# Patient Record
Sex: Male | Born: 1975 | Hispanic: Yes | Marital: Married | Smoking: Never smoker
Health system: Southern US, Community
[De-identification: ages and names within clinical notes are randomized; demographics above are authoritative.]

## PROBLEM LIST (undated history)

## (undated) DIAGNOSIS — B019 Varicella without complication: Secondary | ICD-10-CM

## (undated) DIAGNOSIS — E785 Hyperlipidemia, unspecified: Secondary | ICD-10-CM

## (undated) DIAGNOSIS — I2510 Atherosclerotic heart disease of native coronary artery without angina pectoris: Secondary | ICD-10-CM

## (undated) DIAGNOSIS — I82409 Acute embolism and thrombosis of unspecified deep veins of unspecified lower extremity: Secondary | ICD-10-CM

## (undated) HISTORY — DX: Varicella without complication: B01.9

## (undated) HISTORY — PX: CHOLECYSTECTOMY: SHX55

---

## 2011-02-11 ENCOUNTER — Emergency Department (HOSPITAL_COMMUNITY): Payer: 59

## 2011-02-11 ENCOUNTER — Emergency Department (HOSPITAL_COMMUNITY)
Admission: EM | Admit: 2011-02-11 | Discharge: 2011-02-11 | Disposition: A | Discharge status: Home / Self Care | Payer: 59 | Attending: Emergency Medicine | Admitting: Emergency Medicine

## 2011-02-11 DIAGNOSIS — R5383 Other fatigue: Secondary | ICD-10-CM | POA: Insufficient documentation

## 2011-02-11 DIAGNOSIS — R002 Palpitations: Secondary | ICD-10-CM | POA: Insufficient documentation

## 2011-02-11 DIAGNOSIS — R42 Dizziness and giddiness: Secondary | ICD-10-CM | POA: Insufficient documentation

## 2011-02-11 DIAGNOSIS — R0989 Other specified symptoms and signs involving the circulatory and respiratory systems: Secondary | ICD-10-CM | POA: Insufficient documentation

## 2011-02-11 DIAGNOSIS — R5381 Other malaise: Secondary | ICD-10-CM | POA: Insufficient documentation

## 2011-02-11 DIAGNOSIS — R0609 Other forms of dyspnea: Secondary | ICD-10-CM | POA: Insufficient documentation

## 2011-02-11 LAB — DIFFERENTIAL
Basophils Absolute: 0 10*3/uL (ref 0.0–0.1)
Basophils Relative: 0 % (ref 0–1)
Eosinophils Relative: 3 % (ref 0–5)
Lymphocytes Relative: 28 % (ref 12–46)
Neutro Abs: 8.1 10*3/uL — ABNORMAL HIGH (ref 1.7–7.7)

## 2011-02-11 LAB — POCT I-STAT, CHEM 8
HCT: 50 % (ref 39.0–52.0)
Hemoglobin: 17 g/dL (ref 13.0–17.0)
Potassium: 3.7 mEq/L (ref 3.5–5.1)
Sodium: 141 mEq/L (ref 135–145)

## 2011-02-11 LAB — CBC
HCT: 48 % (ref 39.0–52.0)
Hemoglobin: 16.6 g/dL (ref 13.0–17.0)
RDW: 12.8 % (ref 11.5–15.5)
WBC: 12.9 10*3/uL — ABNORMAL HIGH (ref 4.0–10.5)

## 2011-02-11 LAB — CK TOTAL AND CKMB (NOT AT ARMC)
CK, MB: 3.2 ng/mL (ref 0.3–4.0)
Total CK: 219 U/L (ref 7–232)

## 2011-02-11 LAB — D-DIMER, QUANTITATIVE: D-Dimer, Quant: 0.38 ug/mL-FEU (ref 0.00–0.48)

## 2011-02-16 ENCOUNTER — Emergency Department (INDEPENDENT_AMBULATORY_CARE_PROVIDER_SITE_OTHER): Payer: 59

## 2011-02-16 ENCOUNTER — Emergency Department (HOSPITAL_BASED_OUTPATIENT_CLINIC_OR_DEPARTMENT_OTHER)
Admission: EM | Admit: 2011-02-16 | Discharge: 2011-02-16 | Disposition: A | Discharge status: Home / Self Care | Payer: 59 | Attending: Emergency Medicine | Admitting: Emergency Medicine

## 2011-02-16 DIAGNOSIS — R42 Dizziness and giddiness: Secondary | ICD-10-CM | POA: Insufficient documentation

## 2011-02-16 DIAGNOSIS — R5383 Other fatigue: Secondary | ICD-10-CM

## 2011-02-16 DIAGNOSIS — R5381 Other malaise: Secondary | ICD-10-CM

## 2011-02-16 LAB — BASIC METABOLIC PANEL
BUN: 9 mg/dL (ref 6–23)
CO2: 24 mEq/L (ref 19–32)
Calcium: 9.3 mg/dL (ref 8.4–10.5)
Chloride: 102 mEq/L (ref 96–112)
Creatinine, Ser: 1 mg/dL (ref 0.4–1.5)
Glucose, Bld: 85 mg/dL (ref 70–99)

## 2011-02-16 LAB — CBC
MCH: 29.2 pg (ref 26.0–34.0)
MCHC: 35.3 g/dL (ref 30.0–36.0)
MCV: 82.7 fL (ref 78.0–100.0)
Platelets: 213 10*3/uL (ref 150–400)
RBC: 5.61 MIL/uL (ref 4.22–5.81)
RDW: 12.8 % (ref 11.5–15.5)

## 2011-02-16 LAB — DIFFERENTIAL
Basophils Relative: 1 % (ref 0–1)
Eosinophils Absolute: 0.2 10*3/uL (ref 0.0–0.7)
Eosinophils Relative: 2 % (ref 0–5)
Monocytes Absolute: 0.8 10*3/uL (ref 0.1–1.0)
Monocytes Relative: 7 % (ref 3–12)
Neutrophils Relative %: 67 % (ref 43–77)

## 2011-02-16 LAB — CK TOTAL AND CKMB (NOT AT ARMC): Total CK: 225 U/L (ref 7–232)

## 2011-03-06 ENCOUNTER — Ambulatory Visit: Payer: 59 | Admitting: Internal Medicine

## 2011-03-27 ENCOUNTER — Encounter: Payer: Self-pay | Admitting: Family Medicine

## 2011-03-27 ENCOUNTER — Ambulatory Visit (INDEPENDENT_AMBULATORY_CARE_PROVIDER_SITE_OTHER): Payer: 59 | Admitting: Family Medicine

## 2011-03-27 VITALS — BP 120/90 | HR 96 | Temp 98.8°F | Resp 12 | Ht 70.25 in | Wt 283.0 lb

## 2011-03-27 DIAGNOSIS — R000 Tachycardia, unspecified: Secondary | ICD-10-CM

## 2011-03-27 DIAGNOSIS — R42 Dizziness and giddiness: Secondary | ICD-10-CM

## 2011-03-27 LAB — LIPID PANEL
LDL Cholesterol: 102 mg/dL — ABNORMAL HIGH (ref 0–99)
Total CHOL/HDL Ratio: 5.5 Ratio
VLDL: 34 mg/dL (ref 0–40)

## 2011-03-27 NOTE — Patient Instructions (Signed)
Follow up promptly for any repeat episodes of dizziness.

## 2011-03-27 NOTE — Progress Notes (Signed)
  Subjective:    Patient ID: Keith Case, male    DOB: 1976-08-28, 35 y.o.   MRN: [id number]  HPI Patient seen to establish care. 2 episodes of dizziness recently evaluated in emergency room. He noted increased weakness, lightheadedness, and elevated pulse around 110. He denied any headache or diaphoresis. Denied chest discomfort. Prior workups reviewed. Each episode lasted about 30 minutes. He had several studies including CK enzymes, troponins, d-dimer, CBC, and basic metabolic panel unremarkable with the exception of minimally elevated white blood cell . Denied any associated fever or weakness. Last episode occurred about one month ago. No recurrence since then.  No vertigo and no headaches.  Patient also had 2 chest x-rays which showed no acute findings. CT of head unremarkable. Denies illicit drug use. No alcohol use. Nonsmoker. He has noted that his pulse tends to be slightly high but is not any diarrhea, nausea, vomiting, or new weight changes. Plans to start regular exercise soon.  Past medical history reveals no chronic problems. Previous cholecystectomy around age 41. No regular medications. No known drug allergies.  Family history significant for mother follow up hypertension. Father with type 2 diabetes. Patient is separated. No alcohol use. Nonsmoker. Works full-time   Review of Systems  Constitutional: Negative for fever, chills, fatigue and unexpected weight change.  HENT: Negative for trouble swallowing.   Eyes: Negative for visual disturbance.  Respiratory: Negative for cough and shortness of breath.   Cardiovascular: Negative for chest pain, palpitations and leg swelling.  Gastrointestinal: Negative for nausea, vomiting, abdominal pain, diarrhea and blood in stool.  Genitourinary: Negative for dysuria.  Skin: Negative for rash.  Neurological: Negative for seizures, syncope, weakness and headaches.  Hematological: Negative for adenopathy.  Psychiatric/Behavioral: Negative  for dysphoric mood.       Objective:   Physical Exam  Constitutional: He is oriented to person, place, and time. He appears well-developed and well-nourished. No distress.  HENT:  Right Ear: External ear normal.  Left Ear: External ear normal.  Mouth/Throat: Oropharynx is clear and moist. No oropharyngeal exudate.  Eyes: Pupils are equal, round, and reactive to light.  Neck: Neck supple. No thyromegaly present.  Cardiovascular: Normal rate, regular rhythm and normal heart sounds.   No murmur heard. Pulmonary/Chest: Effort normal and breath sounds normal. No respiratory distress. He has no wheezes. He has no rales.  Musculoskeletal: He exhibits no edema.  Lymphadenopathy:    He has no cervical adenopathy.  Neurological: He is alert and oriented to person, place, and time. No cranial nerve deficit.  Skin: No rash noted.  Psychiatric: He has a normal mood and affect. His behavior is normal.          Assessment & Plan:  Patient presents with episodic dizziness with episodes lasting approximately 30 minutes and none over the past month. Previous workup reviewed and unremarkable. Does have somewhat elevated baseline pulse. Check thyroid functions. Patient also requesting lipid check. He is encouraged to followup promptly if he has any recurrent symptoms.

## 2011-03-31 ENCOUNTER — Telehealth: Payer: Self-pay

## 2011-03-31 NOTE — Telephone Encounter (Signed)
Pt notified and verbalized understanding.

## 2011-03-31 NOTE — Telephone Encounter (Signed)
Message copied by Beverely Low on Tue Mar 31, 2011  2:55 PM ------      Message from: Kristian Covey      Created: Sat Mar 28, 2011 12:42 PM       Thyroid OK.  Lipids significant for low HDL and high triglycerides.  Work on weight loss, exercise and reduction of sugars and starches.

## 2012-04-13 ENCOUNTER — Encounter: Payer: Self-pay | Admitting: Family Medicine

## 2012-04-13 ENCOUNTER — Ambulatory Visit (INDEPENDENT_AMBULATORY_CARE_PROVIDER_SITE_OTHER): Payer: 59 | Admitting: Family Medicine

## 2012-04-13 VITALS — BP 118/78 | Temp 98.2°F | Wt 282.0 lb

## 2012-04-13 DIAGNOSIS — R5383 Other fatigue: Secondary | ICD-10-CM

## 2012-04-13 NOTE — Patient Instructions (Addendum)
Try to get more sleep. Establish more consistent exercise. Try to reduce carbohydrate intake.

## 2012-04-13 NOTE — Progress Notes (Signed)
  Subjective:    Patient ID: Keith Case, male    DOB: [birth year], 36 y.o.   MRN: [id number]  HPI  Patient complains of increased fatigue. Onset 3 days ago and actually feeling somewhat better today. He woke up Monday with very difficult time getting out of bed. He had gotten little sleep over the weekend but no acute symptoms. Denied chest pain. No dyspnea. No fever. No body aches. Has history of loud snoring but no daytime somnolence. No known observed apnea. Takes no medications. Nonsmoker. Has gained some weight recently. Plan is to start weight watcher soon. Currently not exercising regularly. Denies any depression.   Review of Systems  Constitutional: Positive for fatigue. Negative for fever, chills, appetite change and unexpected weight change.  Respiratory: Negative for shortness of breath.   Cardiovascular: Negative for chest pain, palpitations and leg swelling.  Gastrointestinal: Negative for abdominal pain.  Genitourinary: Negative for dysuria.  Neurological: Negative for dizziness and syncope.  Hematological: Negative for adenopathy. Does not bruise/bleed easily.  Psychiatric/Behavioral: Negative for dysphoric mood.       Objective:   Physical Exam  Constitutional: He appears well-developed and well-nourished.  HENT:  Mouth/Throat: Oropharynx is clear and moist.  Neck: Neck supple. No thyromegaly present.  Cardiovascular: Normal rate and regular rhythm.   No murmur heard. Pulmonary/Chest: Effort normal and breath sounds normal. No respiratory distress. He has no wheezes. He has no rales.  Musculoskeletal: He exhibits no edema.  Lymphadenopathy:    He has no cervical adenopathy.          Assessment & Plan:  Patient seen with increased fatigue. Possibly related to recent weight gain and lack of sleep. Start with increasing his sleep time and getting back to weight watchers. Establish consistent exercise. Consider labs and complete physical if has any recurrent  episodes. His fatigue is already better after getting a couple goodnight sleep compared to earlier in the week

## 2012-05-19 ENCOUNTER — Ambulatory Visit (INDEPENDENT_AMBULATORY_CARE_PROVIDER_SITE_OTHER): Payer: 59 | Admitting: Family Medicine

## 2012-05-19 ENCOUNTER — Encounter: Payer: Self-pay | Admitting: Family Medicine

## 2012-05-19 VITALS — BP 110/80 | Temp 98.6°F | Wt 281.0 lb

## 2012-05-19 DIAGNOSIS — G44209 Tension-type headache, unspecified, not intractable: Secondary | ICD-10-CM

## 2012-05-19 MED ORDER — CYCLOBENZAPRINE HCL 5 MG PO TABS: ORAL_TABLET | ORAL | Status: DC

## 2012-05-19 NOTE — Patient Instructions (Addendum)
Try to get more sleep Let me know by next week if headaches not improving.   Tension Headache (Muscle Contraction Headache) Tension headache is one of the most common causes of head pain. These headaches are usually felt as a pain over the top of your head and back of your neck. Stress, anxiety, and depression are common triggers for these headaches. Tension headaches are not life-threatening and will not lead to other types of headaches. Tension headaches can often be diagnosed by taking a history from the patient and a physical exam. Sometimes, further lab and x-ray studies are used to confirm the diagnosis. Your caregiver can advise you on how to get help solving problems that cause anxiety or stress. Antidepressants can be prescribed if depression is a problem. HOME CARE INSTRUCTIONS   If testing was done, call for your results. Remember, it is your responsibility to get the results of all testing. Do not assume everything is fine because you do not hear from your caregiver.   Only take over-the-counter or prescription medicines for pain, discomfort, or fever as directed by your caregiver.   Biofeedback, massage, or other relaxation techniques may be helpful.   Ice packs or heat to the head and neck can be used. Use these three to four times per day or as needed.   Physical therapy may be a useful addition to treatment.   If headaches continue, even with therapy, you may need to think about lifestyle changes.   Avoid excessive use of pain killers, as rebound headaches can occur.  SEEK MEDICAL CARE IF:   You develop problems with medications prescribed.   You do not respond or get no relief from medications.   You have a change from the usual headache.   You develop nausea (feeling sick to your stomach) or vomiting.  SEEK IMMEDIATE MEDICAL CARE IF:   Your headache becomes severe.   You have an unexplained oral temperature above 102 F (38.9 C).   You develop a stiff neck.    You have loss of vision.   You have muscular weakness.   You have loss of muscular control.   You develop severe symptoms different from your first symptoms.   You start losing your balance or have trouble walking.   You feel faint or pass out.  MAKE SURE YOU:   Understand these instructions.   Will watch your condition.   Will get help right away if you are not doing well or get worse.  Document Released: 08/24/2005 Document Revised: 08/13/2011 Document Reviewed: 04/12/2008 [facility] Patient Information 2012 [place], [place].

## 2012-05-19 NOTE — Progress Notes (Signed)
  Subjective:    Patient ID: Keith Case, male    DOB: 09/21/1975, 36 y.o.   MRN: [id number]  HPI  Bifrontal dull headaches. Onset last Thursday. He describes mild to moderate dual relatively constant bifrontal headache. No progression. Has some generalized fatigue. Denies any fever. No nausea or vomiting. No recent tick bites. No sinus congestion symptoms. No history of injury. Advil helps temporarily. He drinks minimal caffeine. No history of similar headache. No confusion. No seizures. No focal weakness.  Past Medical History  Diagnosis Date  . Chicken pox    Past Surgical History  Procedure Date  . Cholecystectomy     reports that he has never smoked. He does not have any smokeless tobacco history on file. His alcohol and drug histories not on file. family history includes Diabetes in his father and other and Hypertension in his father, mother, and other. No Known Allergies    Review of Systems  Constitutional: Positive for fatigue. Negative for fever, chills, appetite change and unexpected weight change.  Respiratory: Negative for cough and shortness of breath.   Cardiovascular: Negative for chest pain.  Gastrointestinal: Negative for abdominal pain.  Genitourinary: Negative for dysuria.  Skin: Negative for rash.  Neurological: Positive for headaches. Negative for seizures, syncope and weakness.  Hematological: Negative for adenopathy.  Psychiatric/Behavioral: Negative for confusion.       Objective:   Physical Exam  Constitutional: He is oriented to person, place, and time. He appears well-developed and well-nourished.  HENT:  Right Ear: External ear normal.  Left Ear: External ear normal.  Mouth/Throat: Oropharynx is clear and moist.  Eyes: Pupils are equal, round, and reactive to light.  Neck: Neck supple. No thyromegaly present.  Cardiovascular: Normal rate and regular rhythm.   Pulmonary/Chest: Effort normal and breath sounds normal. No respiratory distress. He  has no wheezes. He has no rales.  Lymphadenopathy:    He has no cervical adenopathy.  Neurological: He is alert and oriented to person, place, and time. No cranial nerve deficit. Coordination normal.  Skin: No rash noted.  Psychiatric: He has a normal mood and affect. His behavior is normal.          Assessment & Plan:  Headaches. Suspect probable muscle contraction headaches. Avoid chronic use of Advil to avoid analgesic withdrawal headache. Increased sleep. Try cyclobenzaprine 5 mg each bedtime. Touch base next week if not improving

## 2012-12-10 IMAGING — CR DG CHEST 2V
2 series · 2 of 2 positions shown · non-contrast
Comparison: None.

CLINICAL DATA: Shortness of breath.

CHEST - 2 VIEW

[w chest pa]
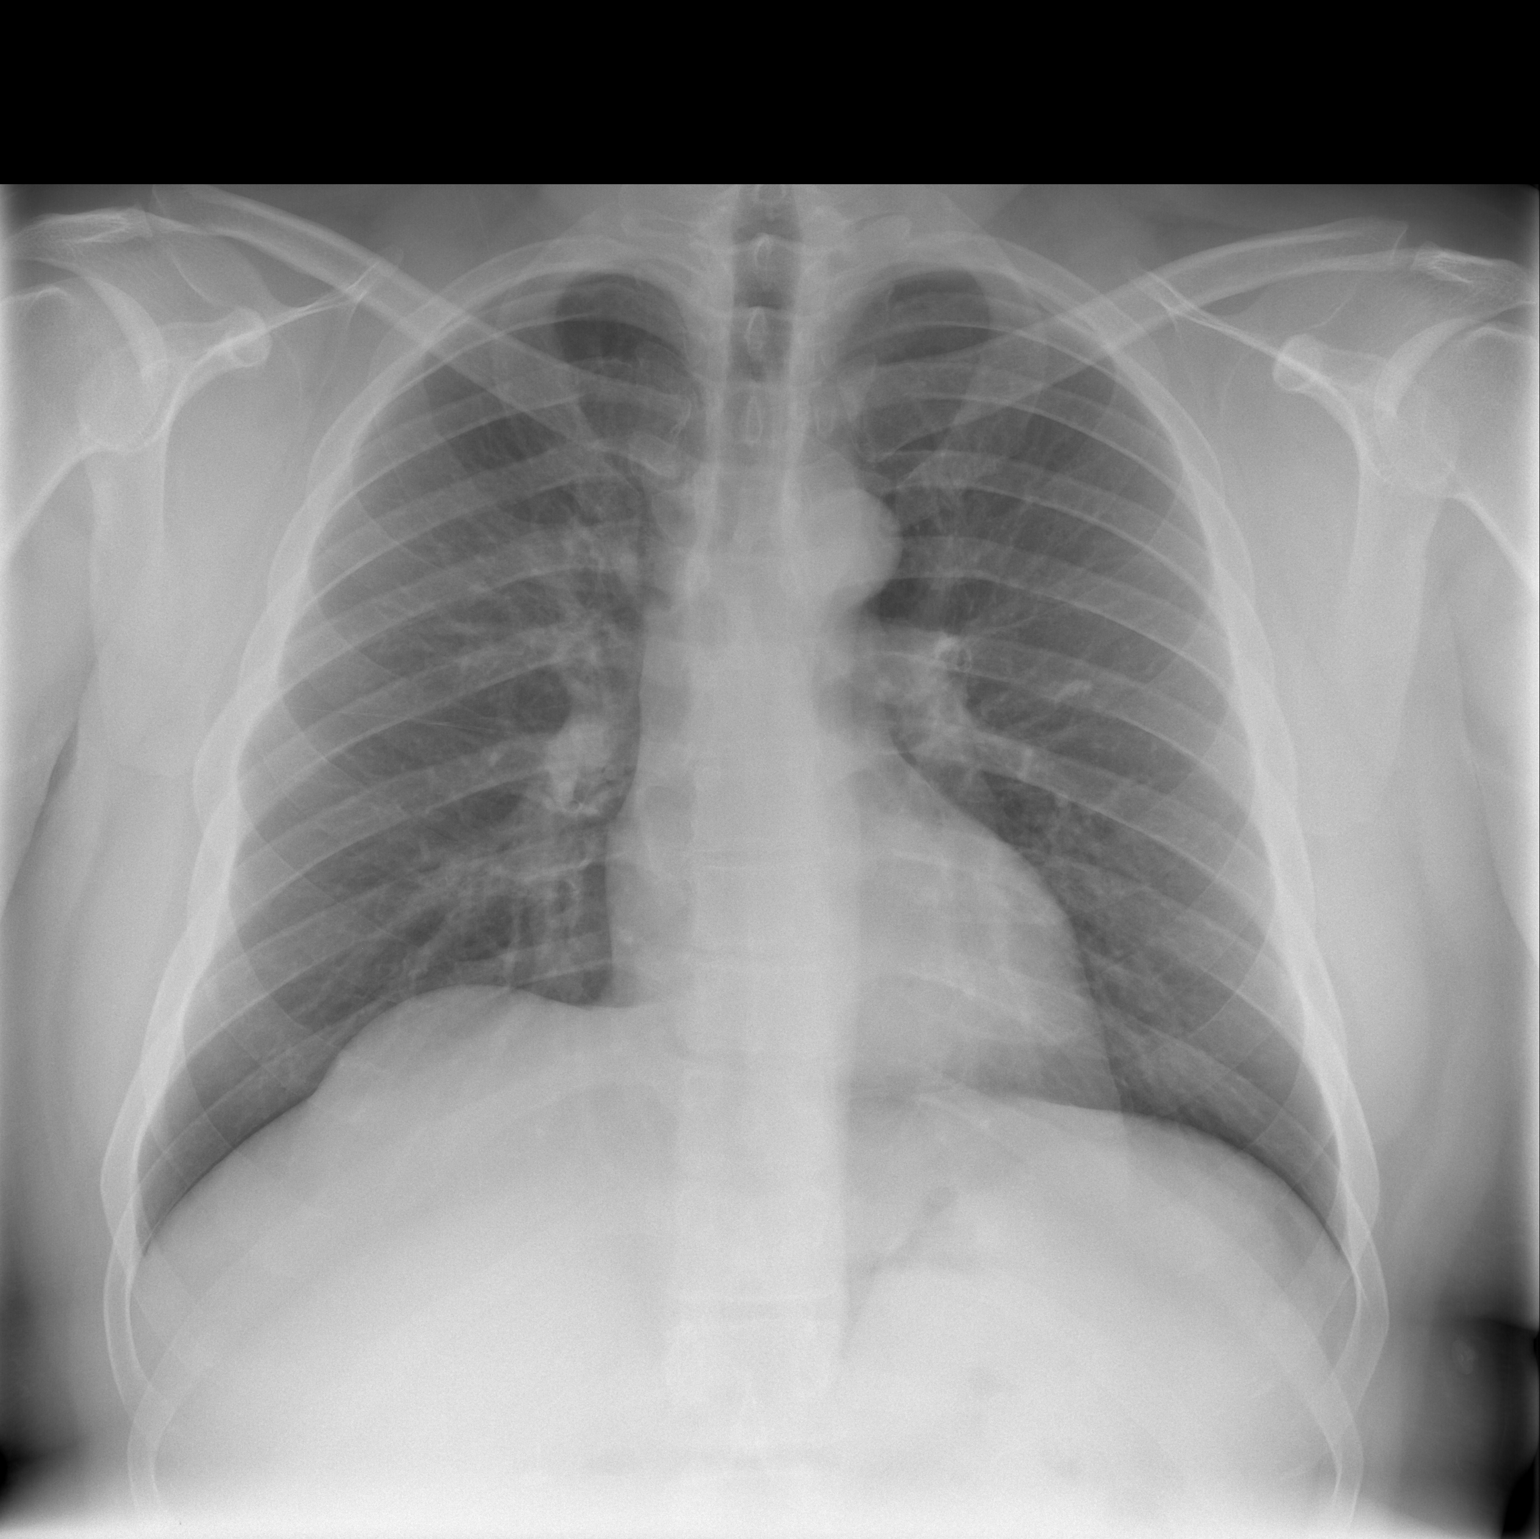

[w chest lat]
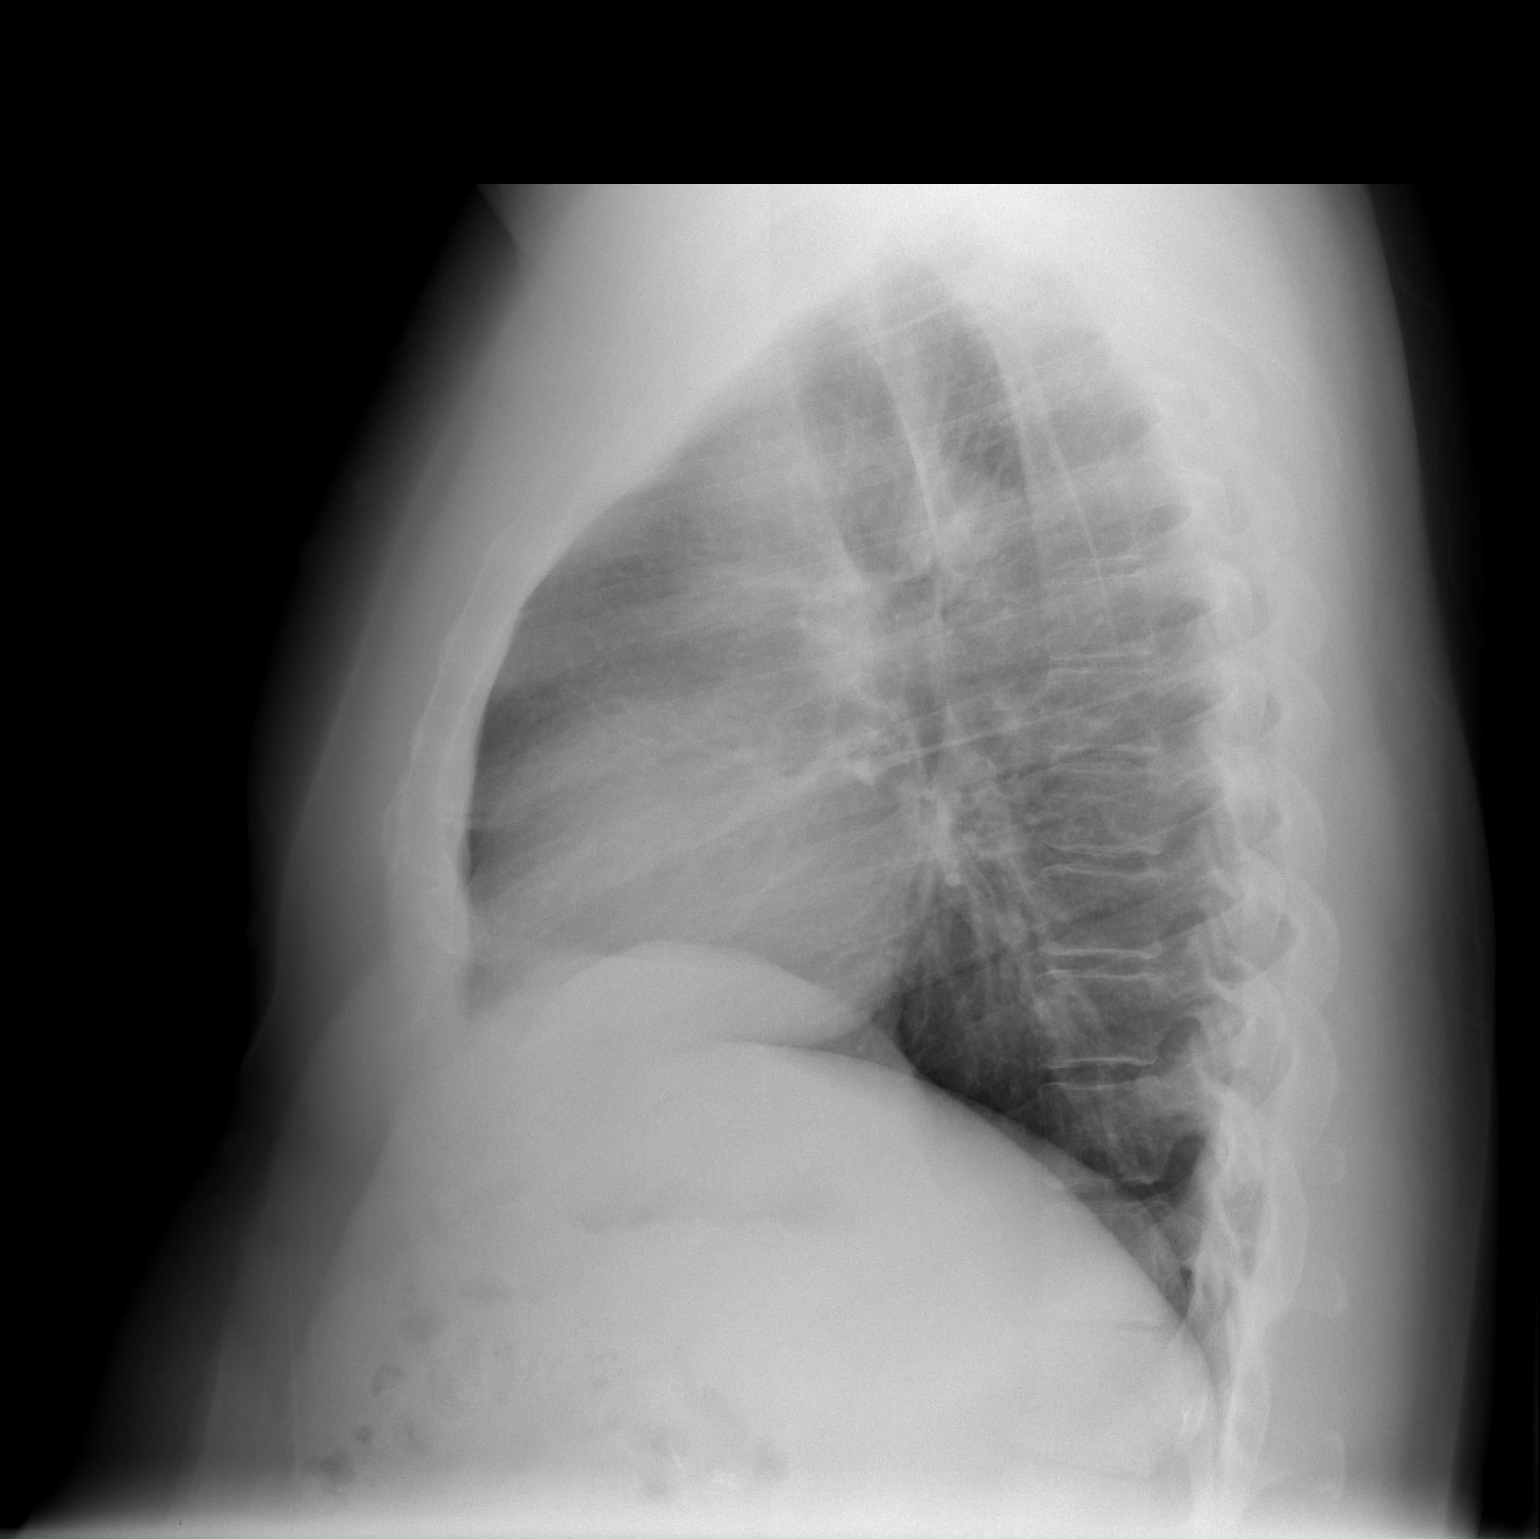

[2 of 2 positions shown; findings below may reference images not displayed]

FINDINGS: Lungs are clear.  No pneumothorax or effusion.  Heart
size normal.
IMPRESSION: Negative chest.

## 2012-12-15 IMAGING — CR DG CHEST 2V
2 series · 2 of 2 positions shown · non-contrast
Comparison: 02/11/2011

CLINICAL DATA: Dizziness

CHEST - 2 VIEW

[w chest pa]
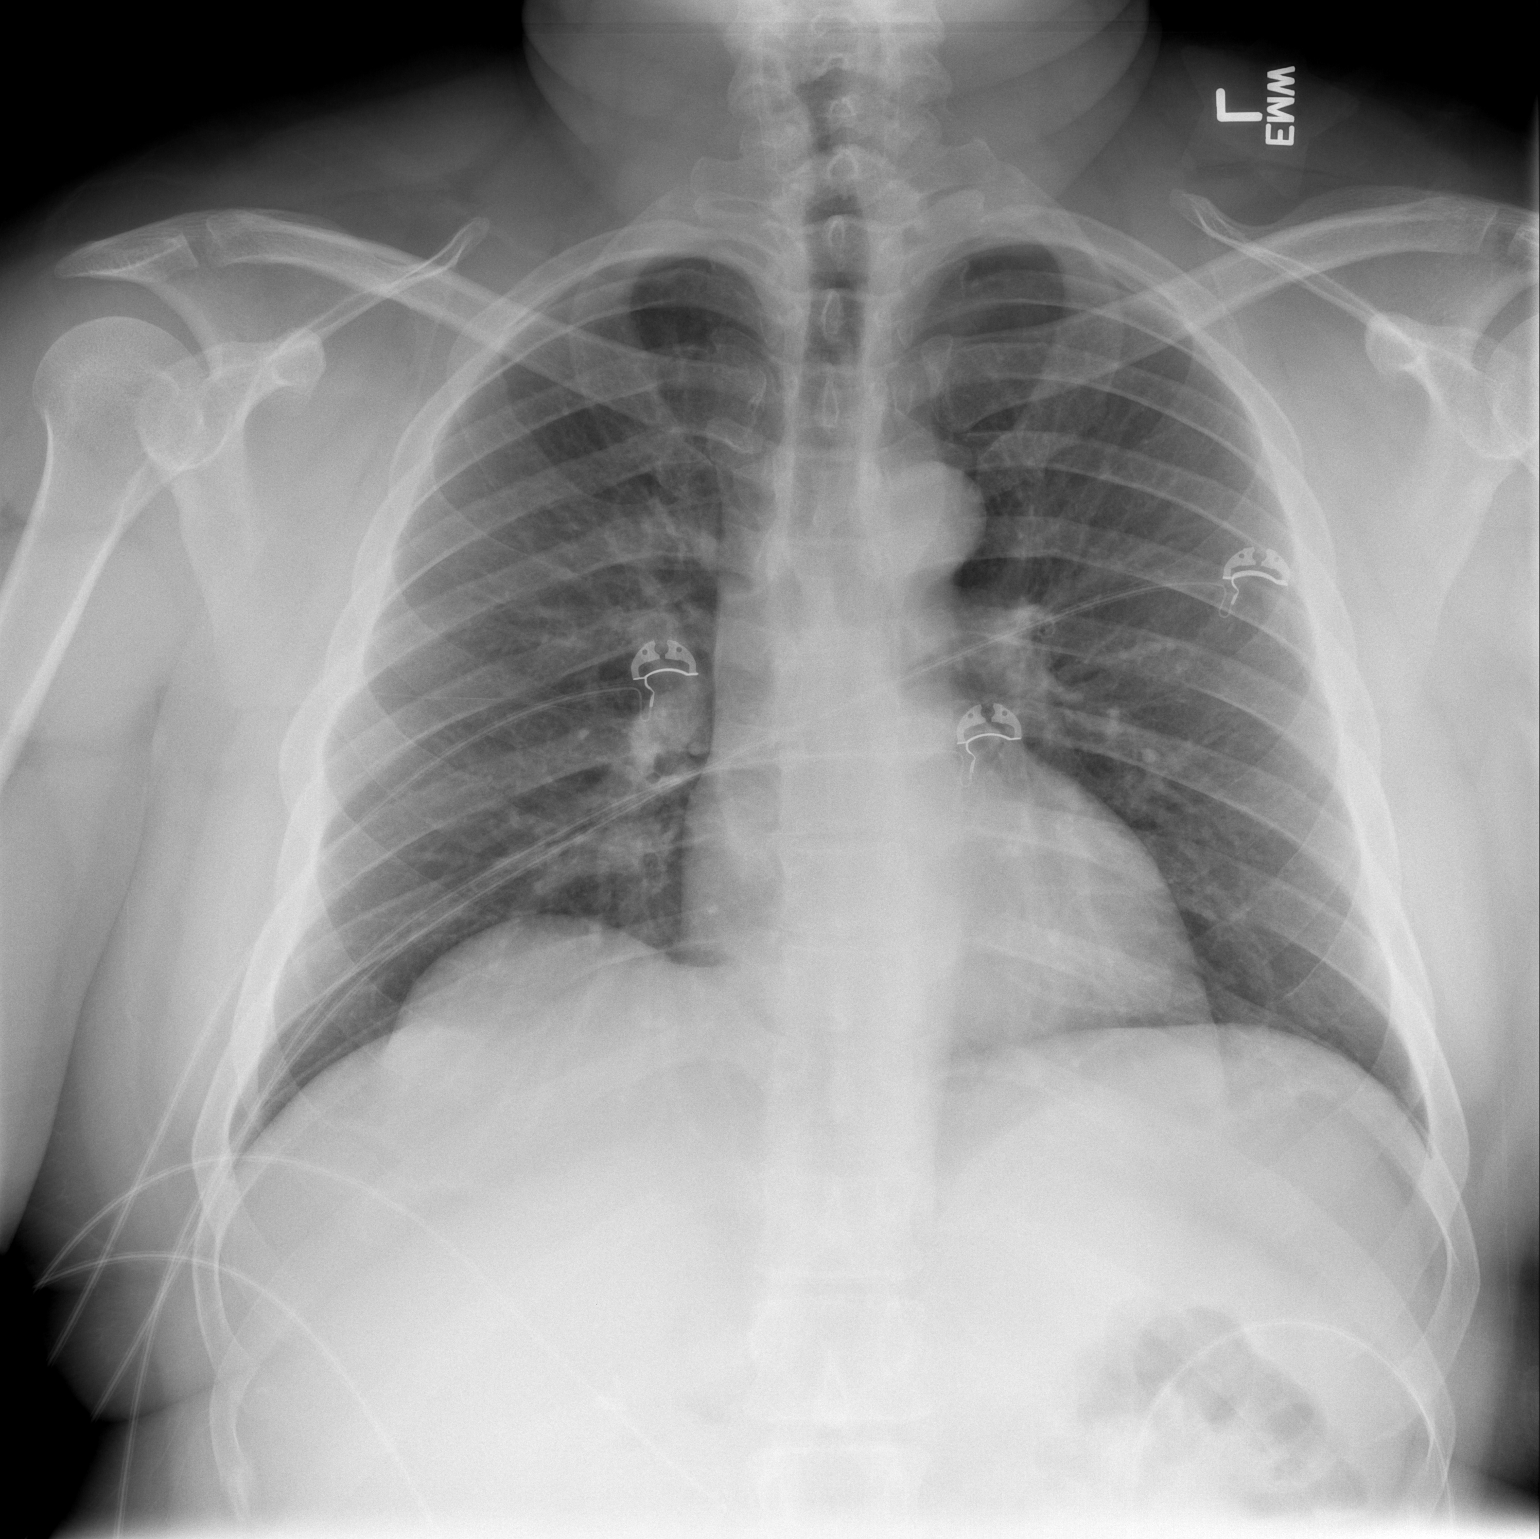

[w chest lat]
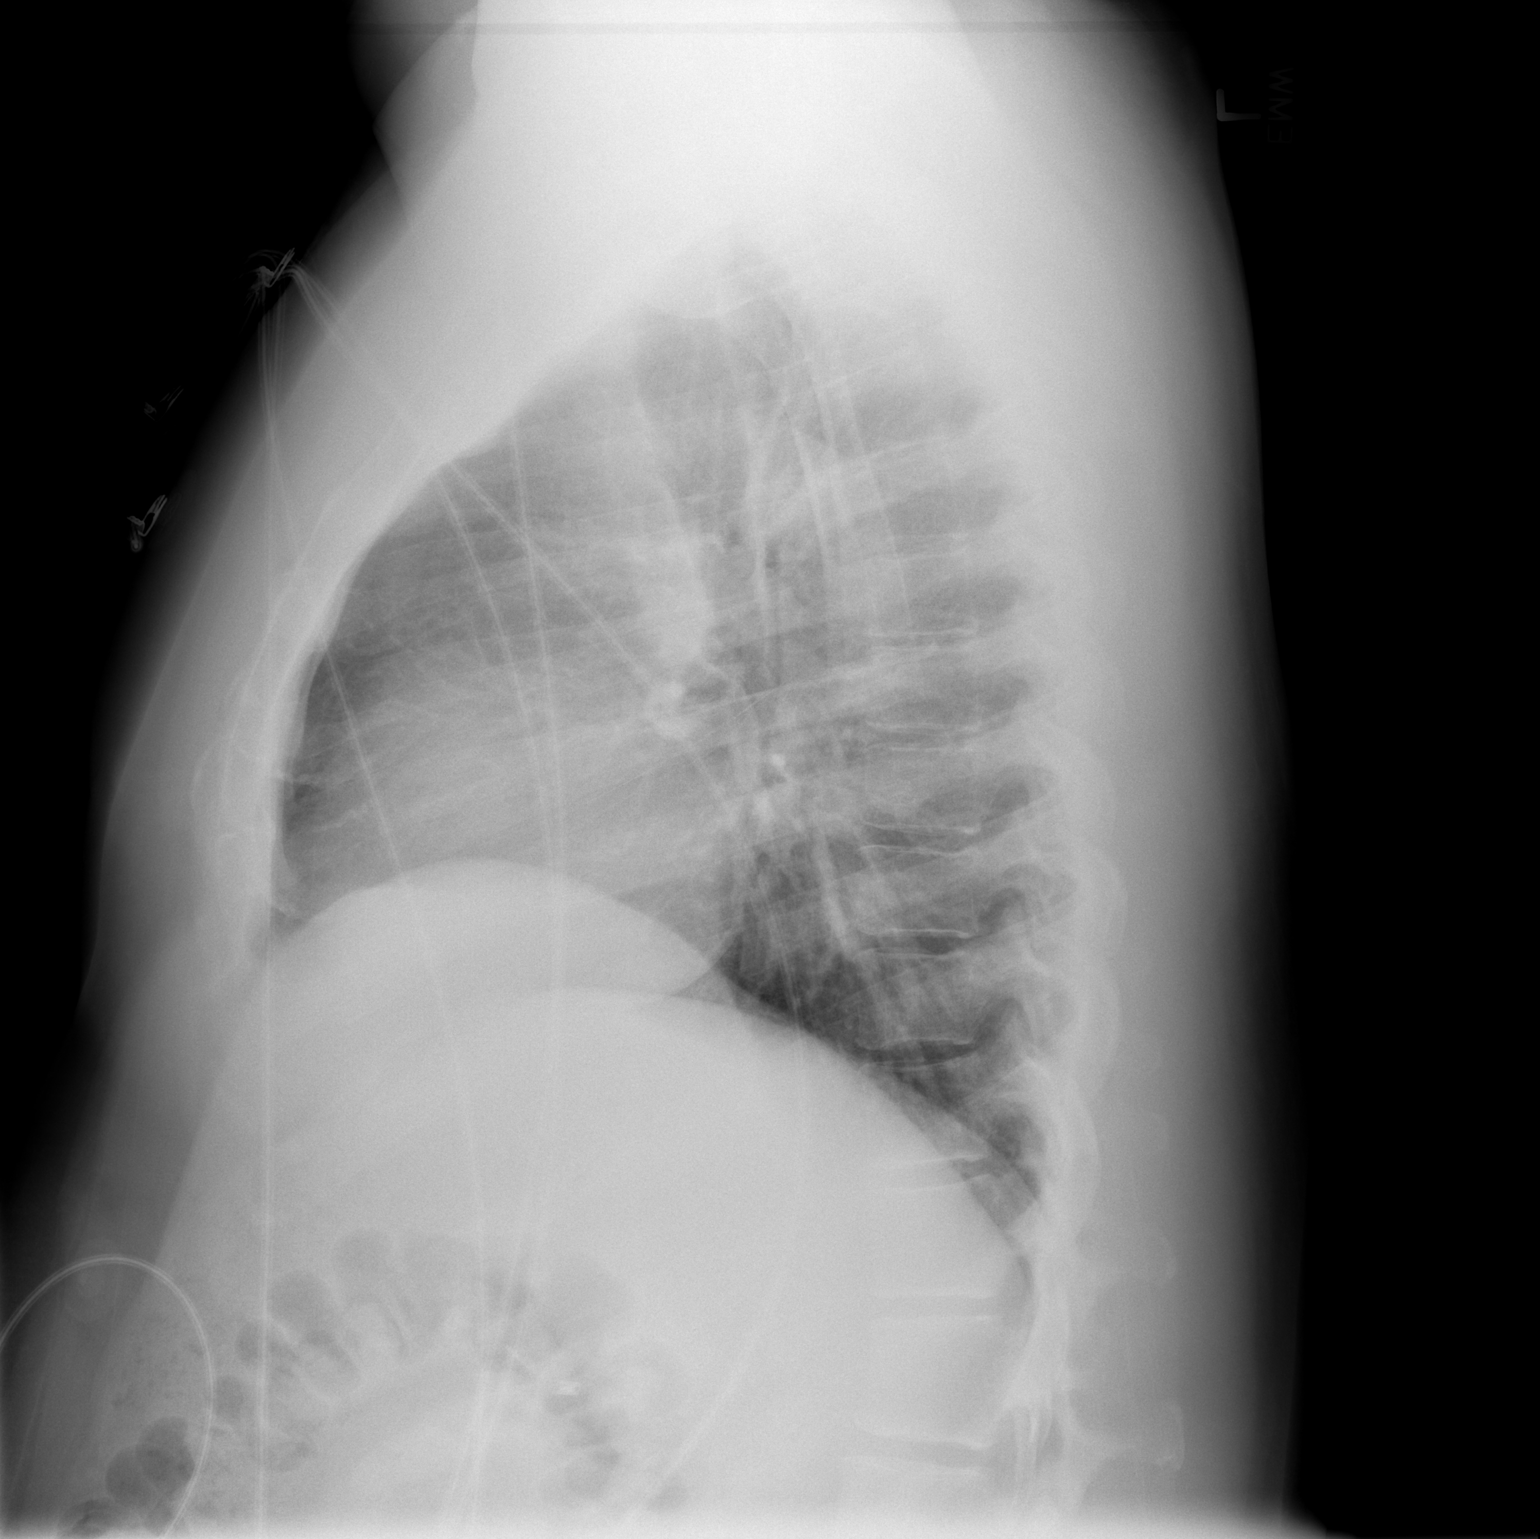

[2 of 2 positions shown; findings below may reference images not displayed]

FINDINGS: Lungs are clear. No pleural effusion or pneumothorax.

Cardiomediastinal silhouette is within normal limits.

Visualized osseous structures are within normal limits.
IMPRESSION: No evidence of acute cardiopulmonary disease.

## 2014-11-01 ENCOUNTER — Emergency Department (HOSPITAL_COMMUNITY)
Admission: EM | Admit: 2014-11-01 | Discharge: 2014-11-01 | Disposition: A | Discharge status: Home / Self Care | Payer: 59 | Attending: Emergency Medicine | Admitting: Emergency Medicine

## 2014-11-01 ENCOUNTER — Emergency Department (HOSPITAL_COMMUNITY)
Admission: EM | Admit: 2014-11-01 | Discharge: 2014-11-01 | Disposition: A | Discharge status: Nursing Home (Includes ICF) | Payer: 59 | Source: Home / Self Care | Attending: Family Medicine | Admitting: Family Medicine

## 2014-11-01 ENCOUNTER — Encounter (HOSPITAL_COMMUNITY): Payer: Self-pay | Admitting: *Deleted

## 2014-11-01 ENCOUNTER — Emergency Department (HOSPITAL_COMMUNITY): Payer: 59

## 2014-11-01 DIAGNOSIS — R079 Chest pain, unspecified: Secondary | ICD-10-CM | POA: Diagnosis not present

## 2014-11-01 DIAGNOSIS — R0789 Other chest pain: Secondary | ICD-10-CM | POA: Diagnosis not present

## 2014-11-01 DIAGNOSIS — R42 Dizziness and giddiness: Secondary | ICD-10-CM | POA: Diagnosis not present

## 2014-11-01 DIAGNOSIS — K219 Gastro-esophageal reflux disease without esophagitis: Secondary | ICD-10-CM | POA: Diagnosis not present

## 2014-11-01 DIAGNOSIS — Z8619 Personal history of other infectious and parasitic diseases: Secondary | ICD-10-CM | POA: Insufficient documentation

## 2014-11-01 LAB — I-STAT TROPONIN, ED: Troponin i, poc: 0 ng/mL (ref 0.00–0.08)

## 2014-11-01 LAB — COMPREHENSIVE METABOLIC PANEL
ALBUMIN: 4 g/dL (ref 3.5–5.2)
ALK PHOS: 76 U/L (ref 39–117)
ALT: 93 U/L — AB (ref 0–53)
AST: 49 U/L — AB (ref 0–37)
Anion gap: 8 (ref 5–15)
BILIRUBIN TOTAL: 0.8 mg/dL (ref 0.3–1.2)
BUN: 9 mg/dL (ref 6–23)
CHLORIDE: 103 mmol/L (ref 96–112)
CO2: 25 mmol/L (ref 19–32)
Calcium: 8.8 mg/dL (ref 8.4–10.5)
Creatinine, Ser: 1 mg/dL (ref 0.50–1.35)
GFR calc Af Amer: >90 mL/min (ref >90)
Glucose, Bld: 93 mg/dL (ref 70–99)
POTASSIUM: 3.8 mmol/L (ref 3.5–5.1)
Sodium: 136 mmol/L (ref 135–145)
Total Protein: 7.4 g/dL (ref 6.0–8.3)

## 2014-11-01 LAB — CBC
HCT: 47.6 % (ref 39.0–52.0)
HEMOGLOBIN: 16.5 g/dL (ref 13.0–17.0)
MCH: 29.3 pg (ref 26.0–34.0)
MCHC: 34.7 g/dL (ref 30.0–36.0)
MCV: 84.4 fL (ref 78.0–100.0)
Platelets: 239 10*3/uL (ref 150–400)
RBC: 5.64 MIL/uL (ref 4.22–5.81)
RDW: 12.9 % (ref 11.5–15.5)
WBC: 11.9 10*3/uL — ABNORMAL HIGH (ref 4.0–10.5)

## 2014-11-01 NOTE — ED Notes (Signed)
Pt  Has  A  History  Of  gerd      He  Reports     Symptoms  Of   chest  Pressure  This  Am        With  Some  dizzyness           Symptoms  Began  Around  1030  Am            -       He  Reports  The  Symptoms  Are  Actually  Better      This  Am             He    Is  Sitting  Upright on  The  Exam table  Speaking in  Complete    sentances         Skin is  Warm  And  Dry       Cap  Refill  Is  Intact

## 2014-11-01 NOTE — ED Provider Notes (Signed)
The patient is a 39 year old male, no past medical history, moderately obese, presents to the hospital after having 2 episodes of chest pain today, one while he was climbing stairs, the other while he was sitting in the waiting room. This was sharp and stabbing, left parasternal, short-lived at less than 1-2 seconds. There was no associated shortness of breath, swelling of the legs, nausea, diaphoresis, near syncope. He has no risk factors for acute coronary syndrome other than his weight, he has no risk factors for pulmonary embolism either. On exam he has clear heart and lung sounds, normal pulses, no peripheral edema, he appears well, has a normal EKG, is very low risk for significant causes of chest pain and at this time as he is asymptomatic is stable for discharge. The patient was informed of his results and the treatment plan and is in agreement.  Medical screening examination/treatment/procedure(s) were conducted as a shared visit with non-physician practitioner(s) and myself.  I personally evaluated the patient during the encounter.  Clinical Impression:   Final diagnoses:  Chest pain, unspecified chest pain type         Keith RollerBrian D Londynn Sonoda, MD 11/02/14 [phone]

## 2014-11-01 NOTE — ED Notes (Signed)
PA at bedside.

## 2014-11-01 NOTE — ED Provider Notes (Signed)
CSN: [id number]     Arrival date & time 11/01/14  1511 History   First MD Initiated Contact with Patient 11/01/14 1841     Chief Complaint  Patient presents with  . Chest Pain     HPI Comments: 39 YO male with a history of GERD presents after an episode of chest pain at the recommendation of urgent care. Pt reports that today when walking up a flight of stairs he felt a severe sharp pain to his left anterior chest with radiation of pain down his left leg; accompanied dizziness. He states the pain was gone after a few seconds and returned to baseline with no pain, dizziness, N/V, diaphoresis. He denies previous episodes of this in the past, no history of trauma.    Past Medical History  Diagnosis Date  . Chicken pox    Past Surgical History  Procedure Laterality Date  . Cholecystectomy     Family History  Problem Relation Age of Onset  . Diabetes Other   . Hypertension Other   . Hypertension Mother   . Hypertension Father   . Diabetes Father    History  Substance Use Topics  . Smoking status: Never Smoker   . Smokeless tobacco: Not on file  . Alcohol Use: No    Review of Systems  All other systems reviewed and are negative.     Allergies  Review of patient's allergies indicates no known allergies.  Home Medications   Prior to Admission medications   Medication Sig Start Date End Date Taking? Authorizing Provider  cyclobenzaprine (FLEXERIL) 5 MG tablet Take one at night 05/19/12   Kristian CoveyBruce W Burchette, MD  Omeprazole (PRILOSEC PO) Take by mouth.    Historical Provider, MD   BP 129/88 mmHg  Pulse 81  Temp(Src) 98.3 F (36.8 C) (Oral)  Resp 16  Ht 5\' 11"  (1.803 m)  SpO2 95% Physical Exam  Constitutional: He is oriented to person, place, and time. He appears well-developed and well-nourished.  HENT:  Head: Normocephalic and atraumatic.  Eyes: Conjunctivae are normal. Pupils are equal, round, and reactive to light. Right eye exhibits no discharge. Left eye exhibits  no discharge. No scleral icterus.  Neck: Normal range of motion. No JVD present. No tracheal deviation present.  Cardiovascular: Normal rate, regular rhythm, normal heart sounds and normal pulses.   Pulmonary/Chest: Effort normal and breath sounds normal. No accessory muscle usage or stridor. No respiratory distress.  No pain with AP/ Lateral compression of chest wall. No signs of trauma, rashes, or scarring   Abdominal: Soft. Normal appearance and bowel sounds are normal. There is no tenderness.  Neurological: He is alert and oriented to person, place, and time. Coordination normal.  Psychiatric: He has a normal mood and affect. His behavior is normal. Judgment and thought content normal.  Nursing note and vitals reviewed.   ED Course  Procedures (including critical care time) Labs Review Labs Reviewed  CBC - Abnormal; Notable for the following:    WBC 11.9 (*)    All other components within normal limits  COMPREHENSIVE METABOLIC PANEL - Abnormal; Notable for the following:    AST 49 (*)    ALT 93 (*)    All other components within normal limits  I-STAT TROPOININ, ED    Imaging Review Dg Chest 2 View  11/01/2014   CLINICAL DATA:  Chest pain since this morning  EXAM: CHEST  2 VIEW  COMPARISON:  02/16/2011  FINDINGS: The heart size and mediastinal contours are  within normal limits. Both lungs are clear. The visualized skeletal structures are unremarkable.  IMPRESSION: No active cardiopulmonary disease.   Electronically Signed   By: Alcide Clever M.D.   On: 11/01/2014 16:14     EKG Interpretation None      Date: 11/01/2014  Rate: 67  Rhythm: normal sinus rhythm  QRS Axis: normal  Intervals: normal  ST/T Wave abnormalities: normal  Conduction Disutrbances:none    MDM   Final diagnoses:  None   Patient denies any symptoms at time of evaluation. No significant risk factors of CAD in addition to obesity; Heart score of 1. Troponin negative, Chest x-ray and EKG normal; likely  not cardiac in nature. Pt discharged home with instructions to return or seek further medical care if symptoms persist or worsen.             Kelle Darting Zehava Turski, PA-C 11/02/14 [id number]  Vida Roller, MD 11/02/14 [phone]

## 2014-11-01 NOTE — Discharge Instructions (Signed)
Chest Pain (Nonspecific) °It is often hard to give a specific diagnosis for the cause of chest pain. There is always a chance that your pain could be related to something serious, such as a heart attack or a blood clot in the lungs. You need to follow up with your health care provider for further evaluation. °CAUSES  °· Heartburn. °· Pneumonia or bronchitis. °· Anxiety or stress. °· Inflammation around your heart (pericarditis) or lung (pleuritis or pleurisy). °· A blood clot in the lung. °· A collapsed lung (pneumothorax). It can develop suddenly on its own (spontaneous pneumothorax) or from trauma to the chest. °· Shingles infection (herpes zoster virus). °The chest wall is composed of bones, muscles, and cartilage. Any of these can be the source of the pain. °· The bones can be bruised by injury. °· The muscles or cartilage can be strained by coughing or overwork. °· The cartilage can be affected by inflammation and become sore (costochondritis). °DIAGNOSIS  °Lab tests or other studies may be needed to find the cause of your pain. Your health care provider may have you take a test called an ambulatory electrocardiogram (ECG). An ECG records your heartbeat patterns over a 24-hour period. You may also have other tests, such as: °· Transthoracic echocardiogram (TTE). During echocardiography, sound waves are used to evaluate how blood flows through your heart. °· Transesophageal echocardiogram (TEE). °· Cardiac monitoring. This allows your health care provider to monitor your heart rate and rhythm in real time. °· Holter monitor. This is a portable device that records your heartbeat and can help diagnose heart arrhythmias. It allows your health care provider to track your heart activity for several days, if needed. °· Stress tests by exercise or by giving medicine that makes the heart beat faster. °TREATMENT  °· Treatment depends on what may be causing your chest pain. Treatment may include: °¨ Acid blockers for  heartburn. °¨ Anti-inflammatory medicine. °¨ Pain medicine for inflammatory conditions. °¨ Antibiotics if an infection is present. °· You may be advised to change lifestyle habits. This includes stopping smoking and avoiding alcohol, caffeine, and chocolate. °· You may be advised to keep your head raised (elevated) when sleeping. This reduces the chance of acid going backward from your stomach into your esophagus. °Most of the time, nonspecific chest pain will improve within 2-3 days with rest and mild pain medicine.  °HOME CARE INSTRUCTIONS  °· If antibiotics were prescribed, take them as directed. Finish them even if you start to feel better. °· For the next few days, avoid physical activities that bring on chest pain. Continue physical activities as directed. °· Do not use any tobacco products, including cigarettes, chewing tobacco, or electronic cigarettes. °· Avoid drinking alcohol. °· Only take medicine as directed by your health care provider. °· Follow your health care provider's suggestions for further testing if your chest pain does not go away. °· Keep any follow-up appointments you made. If you do not go to an appointment, you could develop lasting (chronic) problems with pain. If there is any problem keeping an appointment, call to reschedule. °SEEK MEDICAL CARE IF:  °· Your chest pain does not go away, even after treatment. °· You have a rash with blisters on your chest. °· You have a fever. °SEEK IMMEDIATE MEDICAL CARE IF:  °· You have increased chest pain or pain that spreads to your arm, neck, jaw, back, or abdomen. °· You have shortness of breath. °· You have an increasing cough, or you cough   up blood.  You have severe back or abdominal pain.  You feel nauseous or vomit.  You have severe weakness.  You faint.  You have chills. This is an emergency. Do not wait to see if the pain will go away. Get medical help at once. Call your local emergency services (911 in [place] Do not drive  yourself to the hospital. MAKE SURE YOU:   Understand these instructions.  Will watch your condition.  Will get help right away if you are not doing well or get worse. Document Released: 06/03/2005 Document Revised: 08/29/2013 Document Reviewed: 03/29/2008 [facility] Patient Information 2015 [place]. This information is not intended to replace advice given to you by your health care provider. Make sure you discuss any questions you have with your health care provider.  Pt is advised to use Ibuprofen 800 mg as needed for pain

## 2014-11-01 NOTE — ED Notes (Signed)
[facility] from [facility] for L chest pain, central chest pain, dizziness and weakness after walking up 1 flight of stairs.  Hx of acid reflux.

## 2014-11-01 NOTE — ED Notes (Addendum)
Pt states after walking up some stairs he got a sharp pain in his left chest that lasted a few seconds then went away but he was left with some intermittent chest pressure. Denies sob, but reports that he did get dizzy and felt lightheaded. Pt reports that he had a similar episode last month and was seen at his PCP for it and dx was indigestion. No chest pain at this time. Pt alert, oriented, nad.

## 2014-11-01 NOTE — ED Provider Notes (Signed)
CSN: [id number]     Arrival date & time 11/01/14  1244 History   First MD Initiated Contact with Patient 11/01/14 1422     Chief Complaint  Patient presents with  . Chest Pain   (Consider location/radiation/quality/duration/timing/severity/associated sxs/prior Treatment) HPI Comments: 39 year old male considers himself to be in generally good health was climbing stairs at work today at 10:30 AM and experienced sudden acute of a pinch in the left chest followed by a lengthy period of left anterior chest pressure. For a moment as the pain started he developed sudden dizziness and "faintness" felt as though he may fall. He also complained of weakness in his left leg. He was still having mild left anterior chest pressure while sitting in our waiting room. He denies having shortness of breath, diaphoresis, GI symptoms, nausea, vomiting, reflux, indigestion or pain with movement or taking a deep breath.   Past Medical History  Diagnosis Date  . Chicken pox    Past Surgical History  Procedure Laterality Date  . Cholecystectomy     Family History  Problem Relation Age of Onset  . Diabetes Other   . Hypertension Other   . Hypertension Mother   . Hypertension Father   . Diabetes Father    History  Substance Use Topics  . Smoking status: Never Smoker   . Smokeless tobacco: Not on file  . Alcohol Use: Not on file    Review of Systems  Constitutional: Positive for activity change. Negative for fever and fatigue.  HENT: Negative.   Respiratory: Negative for cough, shortness of breath and wheezing.   Cardiovascular: Positive for chest pain. Negative for leg swelling.  Gastrointestinal: Negative for nausea, vomiting and abdominal pain.  Genitourinary: Negative.   Musculoskeletal: Negative.   Skin: Negative.   Neurological: Negative.     Allergies  Review of patient's allergies indicates no known allergies.  Home Medications   Prior to Admission medications   Medication Sig Start  Date End Date Taking? Authorizing Provider  Omeprazole (PRILOSEC PO) Take by mouth.   Yes Historical Provider, MD  cyclobenzaprine (FLEXERIL) 5 MG tablet Take one at night 05/19/12   Kristian Covey, MD   BP 118/72 mmHg  Pulse 78  Temp(Src) 98.6 F (37 C) (Oral)  Resp 14  SpO2 100% Physical Exam  Constitutional: He appears well-developed and well-nourished. No distress.  Eyes: Conjunctivae are normal.  Neck: Normal range of motion. Neck supple.  Cardiovascular: Normal rate, regular rhythm, normal heart sounds and intact distal pulses.   No murmur heard. Pulmonary/Chest: Effort normal and breath sounds normal. No respiratory distress. He has no wheezes. He has no rales.  Neurological: He is alert. He exhibits normal muscle tone.  Skin: Skin is warm. No rash noted. He is not diaphoretic. No erythema.  Nursing note and vitals reviewed.   ED Course  Procedures (including critical care time) Labs Review Labs Reviewed - No data to display  Imaging Review No results found. EKG: Normal sinus rhythm. No ectopy. Normal axis. No S-ST ischemic type changes.  MDM   1. Left chest pressure   2. Dizziness    After consulting with Dr. Shelly Flatten will transfer this patient to the emergency department for chest pain evaluation. The patient is certain that he is completely asymptomatic at this time. He has no chest pain pressure of any type, no shortness of breath, no dizziness or weakness. His EKG is normal sinus rhythm and otherwise normal. He will be sent down via shuttle.  Hayden Rasmussenavid Tamotsu Wiederholt, NP 11/01/14 1451

## 2015-04-25 ENCOUNTER — Encounter (HOSPITAL_COMMUNITY): Payer: 59

## 2015-04-30 ENCOUNTER — Other Ambulatory Visit (HOSPITAL_COMMUNITY): Payer: Self-pay | Admitting: Family Medicine

## 2015-04-30 ENCOUNTER — Ambulatory Visit (HOSPITAL_COMMUNITY)
Admission: RE | Admit: 2015-04-30 | Discharge: 2015-04-30 | Disposition: A | Discharge status: Home / Self Care | Payer: 59 | Source: Ambulatory Visit | Attending: Cardiology | Admitting: Cardiology

## 2015-04-30 DIAGNOSIS — R52 Pain, unspecified: Secondary | ICD-10-CM | POA: Diagnosis present

## 2015-04-30 DIAGNOSIS — R791 Abnormal coagulation profile: Secondary | ICD-10-CM | POA: Diagnosis not present

## 2015-04-30 DIAGNOSIS — M79605 Pain in left leg: Secondary | ICD-10-CM | POA: Diagnosis not present

## 2016-08-20 ENCOUNTER — Ambulatory Visit: Payer: Self-pay | Admitting: Allergy & Immunology

## 2016-09-24 ENCOUNTER — Ambulatory Visit: Payer: Self-pay | Admitting: Allergy & Immunology

## 2018-10-17 ENCOUNTER — Ambulatory Visit (INDEPENDENT_AMBULATORY_CARE_PROVIDER_SITE_OTHER): Payer: Self-pay | Admitting: Primary Care

## 2022-09-22 DIAGNOSIS — Z1211 Encounter for screening for malignant neoplasm of colon: Secondary | ICD-10-CM | POA: Diagnosis not present

## 2022-10-08 DIAGNOSIS — R42 Dizziness and giddiness: Secondary | ICD-10-CM | POA: Diagnosis not present

## 2022-10-08 DIAGNOSIS — R072 Precordial pain: Secondary | ICD-10-CM | POA: Diagnosis not present

## 2022-10-08 DIAGNOSIS — K219 Gastro-esophageal reflux disease without esophagitis: Secondary | ICD-10-CM | POA: Diagnosis not present

## 2022-10-08 DIAGNOSIS — R079 Chest pain, unspecified: Secondary | ICD-10-CM | POA: Diagnosis not present

## 2022-10-08 DIAGNOSIS — R9431 Abnormal electrocardiogram [ECG] [EKG]: Secondary | ICD-10-CM | POA: Diagnosis not present

## 2022-10-08 DIAGNOSIS — R0789 Other chest pain: Secondary | ICD-10-CM | POA: Diagnosis not present

## 2022-10-08 DIAGNOSIS — I452 Bifascicular block: Secondary | ICD-10-CM | POA: Diagnosis not present

## 2023-01-31 DIAGNOSIS — X58XXXA Exposure to other specified factors, initial encounter: Secondary | ICD-10-CM | POA: Diagnosis not present

## 2023-01-31 DIAGNOSIS — M79604 Pain in right leg: Secondary | ICD-10-CM | POA: Diagnosis not present

## 2023-01-31 DIAGNOSIS — M25561 Pain in right knee: Secondary | ICD-10-CM | POA: Diagnosis not present

## 2023-01-31 DIAGNOSIS — S86911A Strain of unspecified muscle(s) and tendon(s) at lower leg level, right leg, initial encounter: Secondary | ICD-10-CM | POA: Diagnosis not present

## 2023-01-31 DIAGNOSIS — X500XXA Overexertion from strenuous movement or load, initial encounter: Secondary | ICD-10-CM | POA: Diagnosis not present

## 2023-01-31 DIAGNOSIS — M79662 Pain in left lower leg: Secondary | ICD-10-CM | POA: Diagnosis not present

## 2023-01-31 DIAGNOSIS — Y9389 Activity, other specified: Secondary | ICD-10-CM | POA: Diagnosis not present

## 2023-01-31 DIAGNOSIS — S86811A Strain of other muscle(s) and tendon(s) at lower leg level, right leg, initial encounter: Secondary | ICD-10-CM | POA: Diagnosis not present

## 2023-02-09 DIAGNOSIS — M1712 Unilateral primary osteoarthritis, left knee: Secondary | ICD-10-CM | POA: Diagnosis not present

## 2023-02-09 DIAGNOSIS — Z9181 History of falling: Secondary | ICD-10-CM | POA: Diagnosis not present

## 2023-02-09 DIAGNOSIS — M25561 Pain in right knee: Secondary | ICD-10-CM | POA: Diagnosis not present

## 2023-02-15 DIAGNOSIS — G4733 Obstructive sleep apnea (adult) (pediatric): Secondary | ICD-10-CM | POA: Diagnosis not present

## 2023-03-29 DIAGNOSIS — M25561 Pain in right knee: Secondary | ICD-10-CM | POA: Diagnosis not present

## 2023-03-31 DIAGNOSIS — M25561 Pain in right knee: Secondary | ICD-10-CM | POA: Diagnosis not present

## 2023-04-05 DIAGNOSIS — M25561 Pain in right knee: Secondary | ICD-10-CM | POA: Diagnosis not present

## 2023-04-07 DIAGNOSIS — M25561 Pain in right knee: Secondary | ICD-10-CM | POA: Diagnosis not present

## 2023-04-12 DIAGNOSIS — M25561 Pain in right knee: Secondary | ICD-10-CM | POA: Diagnosis not present

## 2023-07-20 DIAGNOSIS — M25461 Effusion, right knee: Secondary | ICD-10-CM | POA: Diagnosis not present

## 2023-07-20 DIAGNOSIS — M170 Bilateral primary osteoarthritis of knee: Secondary | ICD-10-CM | POA: Diagnosis not present

## 2023-07-20 DIAGNOSIS — M25462 Effusion, left knee: Secondary | ICD-10-CM | POA: Diagnosis not present

## 2023-07-20 DIAGNOSIS — M1712 Unilateral primary osteoarthritis, left knee: Secondary | ICD-10-CM | POA: Diagnosis not present

## 2023-08-14 DIAGNOSIS — I4510 Unspecified right bundle-branch block: Secondary | ICD-10-CM | POA: Diagnosis not present

## 2023-08-14 DIAGNOSIS — R059 Cough, unspecified: Secondary | ICD-10-CM | POA: Diagnosis not present

## 2023-08-14 DIAGNOSIS — R918 Other nonspecific abnormal finding of lung field: Secondary | ICD-10-CM | POA: Diagnosis not present

## 2023-08-14 DIAGNOSIS — R0602 Shortness of breath: Secondary | ICD-10-CM | POA: Diagnosis not present

## 2023-08-14 DIAGNOSIS — J180 Bronchopneumonia, unspecified organism: Secondary | ICD-10-CM | POA: Diagnosis not present

## 2023-08-16 NOTE — Discharge Summary (Signed)
 [facility]  [facility] Inpatient Discharge Summary  PCP: Refugia Lofts (Inactive) Discharge Details   Admit date:         08/14/2023 Discharge date:        08/16/2023  Hospital Days:    2 days  Code Status:   Full Code Advanced Directives on file: No Directive        Discharge Diagnoses:  Principal Problem:   Community acquired pneumonia of left lower lobe of lung Active Problems:   Community acquired pneumonia of left lung   Acute hypoxic respiratory failure (*)   Severe obesity (*)   Sepsis without acute organ dysfunction (*)   [employer] Current Status   Urine legionella antigen In process   Culture, Blood Blood Peripheral Preliminary result   Culture, Blood Blood Peripheral Preliminary result       Follow-Up Appointments Suggested: No follow-up provider specified. Follow-Up Appointments Already Scheduled: No future appointments.  Discharge Medications: Current Discharge Medication List     DISCONTINUED medications     ibuprofen (ADVIL,MOTRIN) 800 mg tablet        NEW medications   Details  amoxicillin-clavulanate (AUGMENTIN) 875-125 mg per tablet Take one tablet by mouth 2 (two) times daily for 5 days. Start date: 08/16/2023, End date: 08/21/2023    azithromycin (ZITHROMAX) 500 mg tablet Take one tablet (500 mg dose) by mouth daily for 3 days. Take 1 tablet daily for 3 days. Start date: 08/16/2023, End date: 08/19/2023       CONTINUED medications   Details  acetaminophen  (TYLENOL ) 500 mg tablet Take two tablets (1,000 mg dose) by mouth every 6 (six) hours as needed for Pain.    Pseudoephedrine-APAP-DM (DAYQUIL MULTI-SYMPTOM PO) Take 2 capsules by mouth every 4 (four) hours as needed (cold symptoms).        Allergies: No Known Allergies  Consultations this Admission: None  Procedures/Imaging:     CT Angio Pulmonary  Final Result  IMPRESSION:  1.  No pulmonary embolism.  2.  Findings  suggesting bronchopneumonia in the left lung. Mild, likely reactive mediastinal and left hilar lymphadenopathy.                Electronically Signed by: Prentice Clay, MD on 08/14/2023 5:29 PM    XR Chest Ap Portable  Final Result  IMPRESSION:  No acute pulmonary abnormality.      Electronically Signed by: Ozell JONETTA Cordial, MD on 08/14/2023 1:25 PM      Pertinent Labs:  Cardiac Labs: No results for input(s): CK, CKMB, CTNI, BNP in the last 168 hours. CBC: Recent Labs    Units 08/16/23 0144 08/15/23 0129 08/14/23 1421  WBC thou/mcL 8.6 10.9* 10.3  HGB gm/dL 86.6* 85.3 84.5  PLT thou/mcL 232 234 227   BMP: Recent Labs    Units 08/16/23 0144 08/14/23 1421  NA mmol/L 140 138  K mmol/L 3.5* 3.8  CL mmol/L 104 102  CO2 mmol/L 25 23  BUN mg/dL 9 10  CREATININE mg/dL 9.23 9.15  MAGNESIUM mg/dL 1.9  --    Lipid Panel: No results for input(s): CHOL, TRIG, HDL, LDL in the last 168 hours. Liver Enzymes: Recent Labs    Units 08/16/23 0144 08/14/23 1421  AST U/L 30 30  ALT U/L 39 44  ALKPHOS U/L 57 74  BILITOT mg/dL 0.3 0.7   Endocrine Panels: Recent Labs    Units 08/16/23 0144 08/14/23 2252 08/14/23 1421  HGBA1C %  --  5.6  --   GLUCOSE mg/dL 95  --  95    Hospital Course   Physicians involved in care during this hospitalization Attending Provider: Harlene LITTIE Sample, MD Attending Provider: Yancy Sorrel, MD Attending Provider: Atlee Abernethy, MD Admitting Provider: Yancy Sorrel, MD Consulting Physician: Yancy Sorrel, MD    Hospital Course:     47 yo M treated for sepsis due to L lung mycoplasma PNA. He received IV LR 100 cc/hr, IV azithromycin 500 mg daily, IV ceftriaxone 2 g daily and Mucinex DM 1 tab PO q12. WBC improved from 10.9 to 8.6. On 08/16/2023 he was feeling well enough to go home and was actually asking about going back to work tmr. He will go home with scripts for PO azithromycin and PO augmentin to complete the course  at home. He was advised to wear a mask when outside of his house.    BP (!) 135/98 (Patient Position: Lying)   Pulse 83   Temp 99 F (37.2 C) (Oral)   Resp 18   Ht 1.803 m (5' 11)   Wt 111.8 kg (246 lb 8 oz)   SpO2 (!) 89%   BMI 34.38 kg/m   Physical Exam Constitutional:      Appearance: Normal appearance.  HENT:     Head: Normocephalic.     Mouth/Throat:     Mouth: Mucous membranes are moist.  Cardiovascular:     Rate and Rhythm: Normal rate and regular rhythm.  Pulmonary:     Effort: Pulmonary effort is normal.  Abdominal:     Palpations: Abdomen is soft.  Musculoskeletal:        General: Normal range of motion.     Cervical back: Neck supple.  Skin:    General: Skin is warm.  Neurological:     Mental Status: He is alert. Mental status is at baseline.  Psychiatric:        Mood and Affect: Mood normal.     Post Hospital Care      Oxygen Orders for Discharge: O2 Device: None (Room air) SpO2: 89 %  Diet: Diet and Nourishment Orders (From admission, onward)     Start       08/14/23 1959  Regular Diet  Diet effective now                      Lines/Drains/Airways: Patient Lines/Drains/Airways Status     Active LDAs     Name Placement date Placement time Site Days   Peripheral IV 18 G Left Antecubital 08/14/23  1631  Antecubital  1               Home Health Orders: DME Orders (From admission, onward)    None      Home Health Agency     None       I spent 35 minutes performing discharge services.   Electronically signed: Zackery Sira, MD 08/16/2023 / 9:07 AM

## 2023-08-17 NOTE — Progress Notes (Signed)
 CARE COORDINATOR CONTACT WITH PATIENT/CAREGIVER AFTER HOSPITAL DISCHARGE   Patient contacted.  Encouraged to schedule Hospital follow up appointment with their Non NH PCP.

## 2023-09-06 DIAGNOSIS — J189 Pneumonia, unspecified organism: Secondary | ICD-10-CM | POA: Diagnosis not present

## 2023-09-06 DIAGNOSIS — G4733 Obstructive sleep apnea (adult) (pediatric): Secondary | ICD-10-CM | POA: Diagnosis not present

## 2023-12-13 DIAGNOSIS — M25462 Effusion, left knee: Secondary | ICD-10-CM | POA: Diagnosis not present

## 2023-12-13 DIAGNOSIS — M170 Bilateral primary osteoarthritis of knee: Secondary | ICD-10-CM | POA: Diagnosis not present

## 2023-12-13 DIAGNOSIS — M25461 Effusion, right knee: Secondary | ICD-10-CM | POA: Diagnosis not present

## 2024-04-03 DIAGNOSIS — M1712 Unilateral primary osteoarthritis, left knee: Secondary | ICD-10-CM | POA: Diagnosis not present

## 2024-04-03 DIAGNOSIS — Z96662 Presence of left artificial ankle joint: Secondary | ICD-10-CM | POA: Diagnosis not present

## 2024-04-03 DIAGNOSIS — M96830 Postprocedural hemorrhage and hematoma of a musculoskeletal structure following a musculoskeletal system procedure: Secondary | ICD-10-CM | POA: Diagnosis not present

## 2024-04-03 DIAGNOSIS — Z96652 Presence of left artificial knee joint: Secondary | ICD-10-CM | POA: Diagnosis not present

## 2024-04-03 DIAGNOSIS — Z6835 Body mass index (BMI) 35.0-35.9, adult: Secondary | ICD-10-CM | POA: Diagnosis not present

## 2024-04-03 DIAGNOSIS — M179 Osteoarthritis of knee, unspecified: Secondary | ICD-10-CM | POA: Diagnosis not present

## 2024-04-03 DIAGNOSIS — E669 Obesity, unspecified: Secondary | ICD-10-CM | POA: Diagnosis not present

## 2024-04-03 DIAGNOSIS — Z471 Aftercare following joint replacement surgery: Secondary | ICD-10-CM | POA: Diagnosis not present

## 2024-04-03 DIAGNOSIS — G4733 Obstructive sleep apnea (adult) (pediatric): Secondary | ICD-10-CM | POA: Diagnosis not present

## 2024-04-11 NOTE — ED Provider Notes (Signed)
 [facility]  ED Provider Note  Keith Case 48 y.o. male DOB: [birth year] MRN: [id number] Tele-Medical screening initiated and orders placed by Harlene JINNY Fallow, PA-C. 04/11/2024 / 6:35 PM  48 y.o. male presents with complaints of a fast heart beat.  He had a knee replacement on July 28.  Pt took his pulse and HR 120 ang BP was high.  Pt was sweating during PT.  He also states he is sweating at night.  Pt denies SOB. Denies chest pain.  Pt is taking ASA. NO hx of blood clots.  Patient seen and received a tele-medical screening examination in triage.  The provider performing the medical screening exam was not located at the facility and was located remotely.  Patient understands that the provider is seeing them remotely and consents to the exam.  Additionally, the patient has been advised of the risks and benefits of a video visit that differ from in-person treatment such as: a limited physical examination, unforeseen disruptions to connectivity, risks to patient confidentiality and privacy. Patient was also explained the risks of the exam which include video or sound dysfunction, or minor portions of the exam performed in conjunction with ancillary staff in the ED.  Appropriate orders have been initiated based on my brief physical exam and HPI. Patient placed in appropriate area until a treatment room becomes available for further evaluation and management by the in-house provider.  This tele-medical screening exam was electronically signed by Harlene JINNY Fallow, PA-C on 04/11/2024 at 6:35 PM   History   Chief Complaint  Patient presents with  . Tachycardia    [facility]. His Physical therapist said he needed to get checked out.  He denies any problems.      Patient presents with a history of having a total knee replacement done on July 28 at the [facility] in [place]  Patient is going to physical therapy and today the therapist  became concerned because he had elevated heart rate and was diaphoretic/[facility] who [facility] come to [facility] emergency room to further evaluate make sure no blood clots  Patient has no significant other past medical history  Medicines at this time include his postoperative pain medicine  Allergies, none  Smoking none alcohol none  Operation left knee, cholecystectomy       Past Medical History:  Diagnosis Date  . GERD (gastroesophageal reflux disease)   . Sleep apnea     Past Surgical History:  Procedure Laterality Date  . Cholecystectomy      Social History   Substance and Sexual Activity  Alcohol Use None   Tobacco Use History[1] E-Cigarettes  . Vaping Use Never User   . Start Date    . Cartridges/Day    . Quit Date     Social History   Substance and Sexual Activity  Drug Use Never         Allergies[2]  Current Discharge Medication List     CONTINUE these medications which have NOT CHANGED   Details  acetaminophen  (TYLENOL ) 500 mg tablet Take two tablets (1,000 mg dose) by mouth every 6 (six) hours as needed for Pain.    Pseudoephedrine-APAP-DM (DAYQUIL MULTI-SYMPTOM PO) Take 2 capsules by mouth every 4 (four) hours as needed (cold symptoms).        Primary Survey  Primary Survey  Review of Systems   Review of Systems  Constitutional:  Positive  for diaphoresis. Negative for fever.  Respiratory:  Positive for shortness of breath.   Cardiovascular:  Negative for chest pain.  Gastrointestinal:  Negative for abdominal pain, nausea and vomiting.  All other systems reviewed and are negative.   Physical Exam   ED Triage Vitals [04/11/24 1727]  BP (!) 150/95  Heart Rate 108  Resp 16  SpO2 99 %  Temp 98.4 F (36.9 C)    Physical Exam  Nursing note and vitals reviewed. Constitutional: He appears well-developed. He does not appear distressed.  HENT:  Head: Normocephalic and atraumatic.  Eyes: Pupils are equal, round, and  reactive to light.  Neck: Normal range of motion.  Cardiovascular: Tachycardia present.  Pulmonary/Chest: Respiratory effort normal.  Abdominal: Soft.  Musculoskeletal: Normal range of motion.     Cervical back: Normal range of motion.   Neurological: He is alert.  Skin:  Left knee dressings removed incision is healing well no evidence of dehiscence no evidence of infection  Psychiatric: He has a normal mood and affect.     ED Course   Lab results:   CBC AND DIFFERENTIAL - Abnormal      Result Value   WBC 12.8 (*)    RBC 4.89     HGB 13.9     HCT 42.0     MCV 85.9     MCH 28.4     MCHC 33.1     Plt Ct 450 (*)    RDW SD 42.1     MPV 9.1 (*)    NRBC% 0.0     Absolute NRBC Count 0.00     NEUTROPHIL % 70.1     LYMPHOCYTE % 17.7     MONOCYTE % 6.3     Eosinophil % 4.4     BASOPHIL % 0.4     IG% 1.1     ABSOLUTE NEUTROPHIL COUNT 8.99 (*)    ABSOLUTE LYMPHOCYTE COUNT 2.26     Absolute Monocyte Count 0.80     Absolute Eosinophil Count 0.56 (*)    Absolute Basophil Count 0.05     Absolute Immature Granulocyte Count 0.14 (*)   COMPREHENSIVE METABOLIC PANEL - Abnormal   Na 138     Potassium 4.4     Cl 102     CO2 23     AGAP 13     Glucose 108 (*)    BUN 13     Creatinine 0.86     Ca 9.2     ALK PHOS 93     T Bili 0.7     Total Protein 7.5     Alb 3.7     GLOBULIN 3.8     ALBUMIN/GLOBULIN RATIO 1.0 (*)    BUN/CREAT RATIO 15.1     ALT 40     AST 31     eGFR 107     Comment: Normal GFR (glomerular filtration rate) > 60 mL/min/1.73 meters squared, < 60 may include impaired kidney function. Calculation based on the Chronic Kidney Disease Epidemiology Collaboration (CK-EPI)equation refit without adjustment for race.  URINALYSIS ONLY - NO SYMPTOMS - Abnormal   Urine Color Yellow     Urine Clarity Clear     Urine Specific Gravity 1.029     Urine pH 5.5     Urine Protein - Dipstick 10 (*)    Urine Glucose Negative     Urine Ketones Negative     Urine Bilirubin  Negative     Urine Blood 0.03 (*)  Urine Nitrite Negative     Urine Urobilinogen 2 (*)    Urine Leukocyte Esterase Negative     Urine Squamous Epithelial Cells 0-2     Urine WBC 0-2     Urine RBC 0-2     Urine Bacteria Trace     Urine Mucous 2+ (*)    Urine Hyaline Casts 2-4 (*)    UA Microscopic Yes Micro (*)   PROTIME-INR - Normal   PT 13.9     Comment: Many commonly administered drugs may affect the results obtained in PT and PTT testing.   INR 1.1     Comment: INR Therapeutic Range for DVT, PE:      2.0 - 3.0 INR Mechanical Prosthetic Heart Valves: 2.5 - 3.5    Imaging:   CT ANGIO CHEST PULMONARY   Narrative:    CTA CHEST  TECHNIQUE: Axial images through the chest were obtained following IV administration of 80cc Isovue 370.  Image postprocessing was then performed creating multiplanar 2-D and angiographic 3-D MIP, shaded surface rendering or 3-D volume rendering images for review and comparison.  Dose reduction was utilized (automated exposure control, mA or kV adjustment based on patient size, or iterative image reconstruction).    COMPARISON: 08/14/2023  INDICATION: Other tachycardia after joint surgery, sweating   FINDINGS: . Pulmonary Arteries/Aorta: There is adequate contrast opacification of the pulmonary arteries.  No intravascular filling defect(s) seen to indicate pulmonary emboli.  There is no thoracic aortic aneurysm or dissection seen.  . Mediastinum/Hila: There is no abnormal mediastinal lymphadenopathy. There is no acute abnormality involving heart or pericardium.   . Lungs/Pleura: There is no pulmonary infiltrate or edema. There is no pleural effusion seen.  There is no pneumothorax.  . Bones/Soft Tissues: No acute abnormality.  SABRA Upper Abdomen: No acute abnormality seen in visualized upper abdomen.       Impression:    IMPRESSION: No evidence of pulmonary embolism or other acute abnormality in the chest    Electronically Signed by: Sonny Livings, MD on 04/11/2024 8:58 PM  US  VENOUS LOWER EXTREMITY LEFT   Narrative:    TECHNIQUE: The veins of the left lower extremity were interrogated from the visible common femoral vein to the distal popliteal vein.  The junction of the greater saphenous with the common femoral vein as well as the posterior tibial veins were evaluated.  Gray scale, color, spectral, and doppler sonography was utilized and analyzed. COMPARISON: None.   INDICATION: Swelling    FINDINGS: There is occlusive echogenic thrombus seen within the distal popliteal vein and involving the posterior tibial vein to the mid calf. Flow without is seen in all other interrogated veins of left lower extremity. There is appropriate venous compression and augmentation.  INCIDENTAL FINDINGS:  None    Impression:    IMPRESSION:  Occlusive DVT in distal left popliteal vein posterior tibial vein to the mid calf.    ###CODE CRITICAL REPORT###  Automated notification pathway concerning the above report was activated at the time below.  ORDERING PROVIDER (Unless stated above may not be the provider contacted): Keith Case TIME: 04/11/2024 9:32 PM.  Electronically Signed by: Sonny Livings, MD on 04/11/2024 9:32 PM      ECG: ECG Results          ECG 12 lead (In process)  Result time 04/11/24 19:18:37    In process             Narrative:   Diagnosis Class Abnormal Acquisition  Device D3K Ventricular Rate 107 Atrial Rate 107 P-R Interval 144 QRS Duration 84 Q-T Interval 346 QTC Calculation(Bazett) 461 Calculated P Axis 40 Calculated R Axis -38 Calculated T Axis 34  Diagnosis Sinus tachycardia Left axis deviation Pulmonary disease pattern Abnormal ECG When compared with ECG of 14-Aug-2023 14:41, Incomplete right bundle branch block is no longer present                                                                                        Pre-Sedation Procedures    Medical Decision Making Presented here post left knee replacement/developed elevated heart rate some shortness of breath diaphoresis sent here for evaluation/ Left leg is diffusely swollen postoperatively/ Venous Doppler positive for DVT CT angiogram negative  Will initiate Eliquis   Amount and/or Complexity of Data Reviewed Labs: ordered.  Risk Prescription drug management.            Provider Communication  Current Discharge Medication List     START taking these medications   Details  apixaban  (ELIQUIS ) 5 mg tablet Take two tablets (10 mg total) by mouth 2 (two) times daily for 7 days, followed by 1 tablet (5 mg total) by mouth 2 (two) times daily. Qty: 74 tablet, Refills: 0        Current Discharge Medication List      Current Discharge Medication List      Clinical Impression Final diagnoses:  Acute deep vein thrombosis (DVT) of left lower extremity, unspecified vein (*)    ED Disposition     ED Disposition  Discharge   Condition  Stable   Comment  --                   Electronically signed by:       [1] Social History Tobacco Use  Smoking Status Never  Smokeless Tobacco Never  [2] No Known Allergies  Keith JAYSON Life, MD 04/11/24 2146

## 2024-04-24 ENCOUNTER — Other Ambulatory Visit: Payer: Self-pay

## 2024-04-24 ENCOUNTER — Encounter (HOSPITAL_COMMUNITY): Payer: Self-pay | Admitting: Cardiovascular Disease

## 2024-04-24 ENCOUNTER — Encounter (HOSPITAL_COMMUNITY)
Admission: EM | Disposition: A | Discharge status: Home / Self Care | Payer: Self-pay | Source: Home / Self Care | Attending: Cardiovascular Disease

## 2024-04-24 ENCOUNTER — Inpatient Hospital Stay (HOSPITAL_COMMUNITY)
Admission: EM | Admit: 2024-04-24 | Discharge: 2024-04-26 | DRG: 282 | Disposition: A | Discharge status: Home / Self Care | Attending: Cardiovascular Disease | Admitting: Cardiovascular Disease

## 2024-04-24 DIAGNOSIS — I10 Essential (primary) hypertension: Secondary | ICD-10-CM | POA: Diagnosis present

## 2024-04-24 DIAGNOSIS — Z79899 Other long term (current) drug therapy: Secondary | ICD-10-CM | POA: Diagnosis not present

## 2024-04-24 DIAGNOSIS — Z8249 Family history of ischemic heart disease and other diseases of the circulatory system: Secondary | ICD-10-CM

## 2024-04-24 DIAGNOSIS — Z96652 Presence of left artificial knee joint: Secondary | ICD-10-CM | POA: Diagnosis present

## 2024-04-24 DIAGNOSIS — Z86718 Personal history of other venous thrombosis and embolism: Secondary | ICD-10-CM | POA: Diagnosis not present

## 2024-04-24 DIAGNOSIS — Z7901 Long term (current) use of anticoagulants: Secondary | ICD-10-CM

## 2024-04-24 DIAGNOSIS — I5030 Unspecified diastolic (congestive) heart failure: Secondary | ICD-10-CM | POA: Diagnosis not present

## 2024-04-24 DIAGNOSIS — I82409 Acute embolism and thrombosis of unspecified deep veins of unspecified lower extremity: Secondary | ICD-10-CM | POA: Insufficient documentation

## 2024-04-24 DIAGNOSIS — Z833 Family history of diabetes mellitus: Secondary | ICD-10-CM | POA: Diagnosis not present

## 2024-04-24 DIAGNOSIS — I2510 Atherosclerotic heart disease of native coronary artery without angina pectoris: Secondary | ICD-10-CM

## 2024-04-24 DIAGNOSIS — I2119 ST elevation (STEMI) myocardial infarction involving other coronary artery of inferior wall: Secondary | ICD-10-CM | POA: Diagnosis not present

## 2024-04-24 DIAGNOSIS — E785 Hyperlipidemia, unspecified: Secondary | ICD-10-CM | POA: Diagnosis present

## 2024-04-24 HISTORY — DX: Acute embolism and thrombosis of unspecified deep veins of unspecified lower extremity: I82.409

## 2024-04-24 LAB — CBC WITH DIFFERENTIAL/PLATELET
Abs Immature Granulocytes: 0.07 K/uL (ref 0.00–0.07)
Basophils Absolute: 0 K/uL (ref 0.0–0.1)
Basophils Relative: 0 %
Eosinophils Absolute: 0.3 K/uL (ref 0.0–0.5)
Eosinophils Relative: 3 %
HCT: 43.4 % (ref 39.0–52.0)
Hemoglobin: 14.5 g/dL (ref 13.0–17.0)
Immature Granulocytes: 1 %
Lymphocytes Relative: 17 %
Lymphs Abs: 1.6 K/uL (ref 0.7–4.0)
MCH: 28.3 pg (ref 26.0–34.0)
MCHC: 33.4 g/dL (ref 30.0–36.0)
MCV: 84.8 fL (ref 80.0–100.0)
Monocytes Absolute: 0.4 K/uL (ref 0.1–1.0)
Monocytes Relative: 4 %
Neutro Abs: 7.5 K/uL (ref 1.7–7.7)
Neutrophils Relative %: 75 %
Platelets: 372 K/uL (ref 150–400)
RBC: 5.12 MIL/uL (ref 4.22–5.81)
RDW: 13 % (ref 11.5–15.5)
WBC: 9.9 K/uL (ref 4.0–10.5)
nRBC: 0 % (ref 0.0–0.2)

## 2024-04-24 LAB — HEMOGLOBIN A1C
Hgb A1c MFr Bld: 5.2 % (ref 4.8–5.6)
Mean Plasma Glucose: 102.54 mg/dL

## 2024-04-24 LAB — TROPONIN I (HIGH SENSITIVITY)
Troponin I (High Sensitivity): 4225 ng/L (ref <18)
Troponin I (High Sensitivity): 9538 ng/L (ref <18)
Troponin I (High Sensitivity): 19077 ng/L (ref <18)

## 2024-04-24 LAB — POCT I-STAT, CHEM 8
BUN: 15 mg/dL (ref 6–20)
Calcium, Ion: 1.21 mmol/L (ref 1.15–1.40)
Chloride: 103 mmol/L (ref 98–111)
Creatinine, Ser: 0.8 mg/dL (ref 0.61–1.24)
Glucose, Bld: 150 mg/dL — ABNORMAL HIGH (ref 70–99)
HCT: 37 % — ABNORMAL LOW (ref 39.0–52.0)
Hemoglobin: 12.6 g/dL — ABNORMAL LOW (ref 13.0–17.0)
Potassium: 3.5 mmol/L (ref 3.5–5.1)
Sodium: 138 mmol/L (ref 135–145)
TCO2: 21 mmol/L — ABNORMAL LOW (ref 22–32)

## 2024-04-24 LAB — COMPREHENSIVE METABOLIC PANEL WITH GFR
ALT: 29 U/L (ref 0–44)
AST: 36 U/L (ref 15–41)
Albumin: 3.2 g/dL — ABNORMAL LOW (ref 3.5–5.0)
Alkaline Phosphatase: 74 U/L (ref 38–126)
Anion gap: 8 (ref 5–15)
BUN: 12 mg/dL (ref 6–20)
CO2: 24 mmol/L (ref 22–32)
Calcium: 8.8 mg/dL — ABNORMAL LOW (ref 8.9–10.3)
Chloride: 105 mmol/L (ref 98–111)
Creatinine, Ser: 0.97 mg/dL (ref 0.61–1.24)
GFR, Estimated: >60 mL/min (ref >60)
Glucose, Bld: 132 mg/dL — ABNORMAL HIGH (ref 70–99)
Potassium: 3.8 mmol/L (ref 3.5–5.1)
Sodium: 137 mmol/L (ref 135–145)
Total Bilirubin: 0.5 mg/dL (ref 0.0–1.2)
Total Protein: 7 g/dL (ref 6.5–8.1)

## 2024-04-24 LAB — LIPID PANEL
Cholesterol: 150 mg/dL (ref 0–200)
HDL: 34 mg/dL — ABNORMAL LOW (ref >40)
LDL Cholesterol: 104 mg/dL — ABNORMAL HIGH (ref 0–99)
Total CHOL/HDL Ratio: 4.4 ratio
Triglycerides: 58 mg/dL (ref <150)
VLDL: 12 mg/dL (ref 0–40)

## 2024-04-24 LAB — LACTIC ACID, PLASMA: Lactic Acid, Venous: 1.9 mmol/L (ref 0.5–1.9)

## 2024-04-24 LAB — POCT ACTIVATED CLOTTING TIME
Activated Clotting Time: 285 s
Activated Clotting Time: 256 s

## 2024-04-24 LAB — MRSA NEXT GEN BY PCR, NASAL: MRSA by PCR Next Gen: NOT DETECTED

## 2024-04-24 LAB — CG4 I-STAT (LACTIC ACID): Lactic Acid, Venous: 1.4 mmol/L (ref 0.5–1.9)

## 2024-04-24 LAB — APTT: aPTT: 179 s (ref 24–36)

## 2024-04-24 LAB — PROTIME-INR
INR: 1.2 (ref 0.8–1.2)
Prothrombin Time: 16.3 s — ABNORMAL HIGH (ref 11.4–15.2)

## 2024-04-24 SURGERY — LEFT HEART CATH AND CORONARY ANGIOGRAPHY
Anesthesia: LOCAL

## 2024-04-24 MED ORDER — MORPHINE SULFATE (PF) 2 MG/ML IV SOLN: 1.0000 mg | INTRAVENOUS | Status: DC | PRN

## 2024-04-24 MED ORDER — ONDANSETRON HCL 4 MG/2ML IJ SOLN: 4.0000 mg | Freq: Four times a day (QID) | INTRAMUSCULAR | Status: DC | PRN

## 2024-04-24 MED ORDER — TICAGRELOR 90 MG PO TABS
ORAL_TABLET | ORAL | Status: AC
Start: 2024-04-24 — End: 2024-04-24
  Filled 2024-04-24: qty 2

## 2024-04-24 MED ORDER — HEPARIN SODIUM (PORCINE) 1000 UNIT/ML IJ SOLN
INTRAMUSCULAR | Status: DC | PRN
  Administered 2024-04-24: 12000 [IU] via INTRAVENOUS

## 2024-04-24 MED ORDER — FENTANYL CITRATE (PF) 100 MCG/2ML IJ SOLN
INTRAMUSCULAR | Status: AC
  Filled 2024-04-24: qty 2

## 2024-04-24 MED ORDER — HEPARIN (PORCINE) IN NACL 1000-0.9 UT/500ML-% IV SOLN
INTRAVENOUS | Status: DC | PRN
  Administered 2024-04-24 (×2): 500 mL

## 2024-04-24 MED ORDER — LABETALOL HCL 5 MG/ML IV SOLN
10.0000 mg | INTRAVENOUS | Status: AC | PRN
  Administered 2024-04-24: 10 mg via INTRAVENOUS
  Filled 2024-04-24: qty 4

## 2024-04-24 MED ORDER — VERAPAMIL HCL 2.5 MG/ML IV SOLN: INTRAVENOUS | Status: DC | PRN

## 2024-04-24 MED ORDER — HEPARIN (PORCINE) 25000 UT/250ML-% IV SOLN
1650.0000 [IU]/h | INTRAVENOUS | Status: AC
  Administered 2024-04-25: 1450 [IU]/h via INTRAVENOUS
  Administered 2024-04-25: 1650 [IU]/h via INTRAVENOUS
  Filled 2024-04-24 (×2): qty 250

## 2024-04-24 MED ORDER — HEPARIN SODIUM (PORCINE) 1000 UNIT/ML IJ SOLN
INTRAMUSCULAR | Status: AC
  Filled 2024-04-24: qty 10

## 2024-04-24 MED ORDER — TICAGRELOR 90 MG PO TABS
ORAL_TABLET | ORAL | Status: DC | PRN
  Administered 2024-04-24: 180 mg via ORAL

## 2024-04-24 MED ORDER — OXYCODONE HCL 5 MG PO TABS: 5.0000 mg | ORAL_TABLET | ORAL | Status: DC | PRN

## 2024-04-24 MED ORDER — ORAL CARE MOUTH RINSE: 15.0000 mL | OROMUCOSAL | Status: DC | PRN

## 2024-04-24 MED ORDER — FENTANYL CITRATE (PF) 100 MCG/2ML IJ SOLN
INTRAMUSCULAR | Status: DC | PRN
  Administered 2024-04-24 (×2): 25 ug via INTRAVENOUS

## 2024-04-24 MED ORDER — IOHEXOL 350 MG/ML SOLN
INTRAVENOUS | Status: DC | PRN
Start: 2024-04-24 — End: 2024-04-24
  Administered 2024-04-24: 105 mL

## 2024-04-24 MED ORDER — HYDRALAZINE HCL 20 MG/ML IJ SOLN
INTRAMUSCULAR | Status: AC
  Filled 2024-04-24: qty 1

## 2024-04-24 MED ORDER — TICAGRELOR 90 MG PO TABS
90.0000 mg | ORAL_TABLET | Freq: Two times a day (BID) | ORAL | Status: DC
  Administered 2024-04-24: 90 mg via ORAL
  Filled 2024-04-24: qty 1

## 2024-04-24 MED ORDER — LIDOCAINE HCL (PF) 1 % IJ SOLN
INTRAMUSCULAR | Status: DC | PRN
  Administered 2024-04-24: 2 mL

## 2024-04-24 MED ORDER — HYDRALAZINE HCL 20 MG/ML IJ SOLN
10.0000 mg | INTRAMUSCULAR | Status: AC | PRN
Start: 2024-04-24 — End: 2024-04-24

## 2024-04-24 MED ORDER — TIROFIBAN HCL IN NACL 5-0.9 MG/100ML-% IV SOLN
INTRAVENOUS | Status: AC | PRN
  Administered 2024-04-24: .15 ug/kg/min via INTRAVENOUS

## 2024-04-24 MED ORDER — MIDAZOLAM HCL 2 MG/2ML IJ SOLN
INTRAMUSCULAR | Status: DC | PRN
  Administered 2024-04-24 (×2): 1 mg via INTRAVENOUS

## 2024-04-24 MED ORDER — HYDRALAZINE HCL 20 MG/ML IJ SOLN
INTRAMUSCULAR | Status: DC | PRN
  Administered 2024-04-24: 10 mg via INTRAVENOUS

## 2024-04-24 MED ORDER — ACETAMINOPHEN 325 MG PO TABS: 650.0000 mg | ORAL_TABLET | ORAL | Status: DC | PRN

## 2024-04-24 MED ORDER — SODIUM CHLORIDE 0.9% FLUSH
3.0000 mL | Freq: Two times a day (BID) | INTRAVENOUS | Status: DC
  Administered 2024-04-24 – 2024-04-26 (×3): 3 mL via INTRAVENOUS

## 2024-04-24 MED ORDER — FREE WATER
500.0000 mL | Freq: Once | Status: AC
  Administered 2024-04-24: 500 mL via ORAL

## 2024-04-24 MED ORDER — MIDAZOLAM HCL 2 MG/2ML IJ SOLN
INTRAMUSCULAR | Status: AC
  Filled 2024-04-24: qty 2

## 2024-04-24 MED ORDER — ATORVASTATIN CALCIUM 80 MG PO TABS
80.0000 mg | ORAL_TABLET | Freq: Every day | ORAL | Status: DC
  Administered 2024-04-24 – 2024-04-26 (×3): 80 mg via ORAL
  Filled 2024-04-24 (×3): qty 1

## 2024-04-24 MED ORDER — SODIUM CHLORIDE 0.9% FLUSH: 3.0000 mL | INTRAVENOUS | Status: DC | PRN

## 2024-04-24 MED ORDER — TIROFIBAN (AGGRASTAT) BOLUS VIA INFUSION
INTRAVENOUS | Status: DC | PRN
  Administered 2024-04-24: 2825 ug via INTRAVENOUS

## 2024-04-24 MED ORDER — LIDOCAINE HCL (PF) 1 % IJ SOLN
INTRAMUSCULAR | Status: AC
  Filled 2024-04-24: qty 30

## 2024-04-24 MED ORDER — SODIUM CHLORIDE 0.9 % IV SOLN: 250.0000 mL | INTRAVENOUS | Status: AC | PRN

## 2024-04-24 MED ORDER — ASPIRIN 81 MG PO CHEW
81.0000 mg | CHEWABLE_TABLET | Freq: Every day | ORAL | Status: DC
  Administered 2024-04-25 – 2024-04-26 (×2): 81 mg via ORAL
  Filled 2024-04-24 (×2): qty 1

## 2024-04-24 MED ORDER — VERAPAMIL HCL 2.5 MG/ML IV SOLN
INTRAVENOUS | Status: AC
  Filled 2024-04-24: qty 2

## 2024-04-24 SURGICAL SUPPLY — 16 items
BALLOON EMERGE MR 2.0X12 (BALLOONS) IMPLANT
BALLOON SPRINTER MX2 OTW 1.5X6 (BALLOONS) IMPLANT
CATH 5FR JL3.5 JR4 ANG PIG MP (CATHETERS) IMPLANT
CATH LAUNCHER 6FR AL.75 (CATHETERS) IMPLANT
CATH VISTA GUIDE 6FR JR4 ECOPK (CATHETERS) IMPLANT
DEVICE RAD COMP TR BAND LRG (VASCULAR PRODUCTS) IMPLANT
GLIDESHEATH SLEND SS 6F .021 (SHEATH) IMPLANT
GUIDEWIRE INQWIRE 1.5J.035X260 (WIRE) IMPLANT
KIT ENCORE 26 ADVANTAGE (KITS) IMPLANT
KIT SINGLE USE MANIFOLD (KITS) IMPLANT
PACK CARDIAC CATHETERIZATION (CUSTOM PROCEDURE TRAY) ×1 IMPLANT
SET ATX-X65L (MISCELLANEOUS) IMPLANT
WIRE ASAHI PROWATER 180CM (WIRE) IMPLANT
WIRE HI TORQ WHISPER MS 190CM (WIRE) IMPLANT
WIRE HI TORQ WHISPER MS 300CM (WIRE) IMPLANT
WIRE RUNTHROUGH .014X300CM (WIRE) IMPLANT

## 2024-04-24 NOTE — Plan of Care (Signed)
  Problem: Cardiovascular: Goal: Vascular access site(s) Level 0-1 will be maintained Outcome: Progressing   

## 2024-04-24 NOTE — Progress Notes (Signed)
 PHARMACY - ANTICOAGULATION CONSULT NOTE  Pharmacy Consult for heparin  Indication: chest pain/ACS  No Known Allergies  Patient Measurements:    Vital Signs: Temp: 98.8 F (37.1 C) (08/18 1700) Temp Source: Oral (08/18 1700) BP: 158/102 (08/18 1645) Pulse Rate: 95 (08/18 1745)  Labs: Recent Labs    04/24/24 1521 04/24/24 1733  HGB 12.6* 14.5  HCT 37.0* 43.4  PLT  --  372  CREATININE 0.80  --     CrCl cannot be calculated (Unknown ideal weight.).   Medical History: Past Medical History:  Diagnosis Date   Chicken pox    DVT (deep venous thrombosis) (HCC)       Assessment: 58 yoM admitted as code STEMI. Unable to cross lesion so pharmacy to start IV heparin  x24h. Heparin  to resume 2h after radial band removed. Per RN, radial band deflated and will be off ~2200. Pt on apixaban  with last dose 8/18 ~1000 so will dose via aPTTs.  Goal of Therapy:  Heparin  level 0.3-0.7 units/ml aPTT 66-102 seconds Monitor platelets by anticoagulation protocol: Yes   Plan:  Heparin  1450 units/h no bolus at midnight Check 6h aPTT, heparin  level, CBC  Ozell Jamaica, PharmD, BCPS, [facility] Clinical Pharmacist [phone] Please check AMION for all [facility] Pharmacy numbers 04/24/2024

## 2024-04-24 NOTE — H&P (Signed)
 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 EKG with 1 mm inferior STEMI. Code STEMI called by ED staff. Pt with ongoing chest pain on arrival to [facility].    Past Medical History:  Diagnosis Date   Chicken pox    DVT (deep venous thrombosis) (HCC)    Past Surgical History:  Procedure Laterality Date   CHOLECYSTECTOMY     TOTAL KNEE ARTHROPLASTY Left      Medications Prior to Admission: Prior to Admission medications   Medication Sig Start Date End Date Taking? Authorizing Provider  cyclobenzaprine  (FLEXERIL ) 5 MG tablet Take one at night 05/19/12   Burchette, Wolm ORN, MD  Omeprazole (PRILOSEC PO) Take by mouth.    [provider]     Allergies:   No Known Allergies  Social History:   Social History   Socioeconomic History   Marital status: Married    Spouse name: Not on file   Number of children: Not on file   Years of education: Not on file   Highest education level: Not on file  Occupational History   Not on file  Tobacco Use   Smoking status: Never   Smokeless tobacco: Not on file  Substance and Sexual Activity   Alcohol use: No   Drug use: No   Sexual activity: Not on file  Other Topics Concern   Not on file  Social History Narrative   Not on file   Social Drivers of Health   Financial Resource Strain: Not on file  Food Insecurity: No Food Insecurity (08/14/2023)   Received from [facility]   Hunger Vital Sign    Within the past 12 months, you worried that your food would run out before you got the money to buy more.: Never  true    Within the past 12 months, the food you bought just didn't last and you didn't have money to get more.: Never true  Transportation Needs: No Transportation Needs (08/14/2023)   Received from [facility] - Transportation    Lack of Transportation (Medical): No    Lack of Transportation (Non-Medical): No  Physical Activity: Not on file  Stress: No Stress Concern Present (08/14/2023)   Received from [facility] - Occupational Stress Questionnaire    Feeling of Stress : Not at all  Social Connections: Not on file  Intimate Partner Violence: Not At Risk (04/11/2024)   Received from [facility]   HITS    Over the last 12 months how often did your partner physically hurt you?: Never    Over the last 12 months how often did your partner insult you or talk down to you?: Never    Over the last 12 months how often did your partner threaten you with physical harm?: Never    Over the last 12 months how often did your partner scream or curse at you?: Never     Family History:   The patient's family history includes Diabetes in his father and another family member; Hypertension  in his father, mother, and another family member.    ROS:  Please see the history of present illness.  All other ROS reviewed and negative.     Physical Exam/Data: There were no vitals filed for this visit. No intake or output data in the 24 hours ending 04/24/24 1455    05/19/2012   12:04 PM 04/13/2012    8:50 AM 03/27/2011    4:08 PM  Last 3 Weights  Weight (lbs) 281 lb 282 lb 283 lb  Weight (kg) 127.461 kg 127.914 kg 128.368 kg     There is no height or weight on file to calculate BMI.  General:  Well nourished, well developed, in no acute distress HEENT: normal Neck: no JVD Vascular: No carotid bruits; Distal pulses 2+ bilaterally   Cardiac:  normal S1, S2; RRR; no murmur  Lungs:  clear to auscultation bilaterally, no wheezing, rhonchi or rales   Abd: soft, nontender, no hepatomegaly  Ext: no LE edema Musculoskeletal:  No deformities, BUE and BLE strength normal and equal Skin: warm and dry  Neuro:  CNs 2-12 intact, no focal abnormalities noted Psych:  Normal affect   EKG:  The ECG that was done was personally reviewed and demonstrates sinus with 1 mm inferior ST elevation.   Relevant CV Studies:   Laboratory Data: High Sensitivity Troponin:  No results for input(s): TROPONINIHS in the last 720 hours.    ChemistryNo results for input(s): NA, K, CL, CO2, GLUCOSE, BUN, CREATININE, CALCIUM , MG, GFRNONAA, GFRAA, ANIONGAP in the last 168 hours.  No results for input(s): PROT, ALBUMIN, AST, ALT, ALKPHOS, BILITOT in the last 168 hours. Lipids No results for input(s): CHOL, TRIG, HDL, LABVLDL, LDLCALC, CHOLHDL in the last 168 hours. HematologyNo results for input(s): WBC, RBC, HGB, HCT, MCV, MCH, MCHC, RDW, PLT in the last 168 hours. Thyroid No results for input(s): TSH, FREET4 in the last 168 hours. BNPNo results for input(s): BNP, PROBNP in the last 168 hours.  DDimer No results for input(s): DDIMER in the last 168 hours.  Radiology/Studies:  No results found.   Assessment and Plan: Acute inferior STEMI: Will plan emergent cardiac cath. Further plans to follow post cath.   Code Status: Full Code  Severity of Illness: The appropriate patient status for this patient is INPATIENT. Inpatient status is judged to be reasonable and necessary in order to provide the required intensity of service to ensure the patient's safety. The patient's presenting symptoms, physical exam findings, and initial radiographic and laboratory data in the context of their chronic comorbidities is felt to place them at high risk for further clinical deterioration. Furthermore, it is not anticipated that the patient will be medically stable for discharge from the hospital within 2  midnights of admission.   * I certify that at the point of admission it is my clinical judgment that the patient will require inpatient hospital care spanning beyond 2 midnights from the point of admission due to high intensity of service, high risk for further deterioration and high frequency of surveillance required.*  For questions or updates, please contact [facility] Please consult [email or web address] for contact info under     Signed, Lonni Cash, MD  04/24/2024 2:55 PM

## 2024-04-25 ENCOUNTER — Inpatient Hospital Stay (HOSPITAL_COMMUNITY)

## 2024-04-25 DIAGNOSIS — I5030 Unspecified diastolic (congestive) heart failure: Secondary | ICD-10-CM

## 2024-04-25 DIAGNOSIS — E785 Hyperlipidemia, unspecified: Secondary | ICD-10-CM | POA: Diagnosis not present

## 2024-04-25 DIAGNOSIS — I2119 ST elevation (STEMI) myocardial infarction involving other coronary artery of inferior wall: Secondary | ICD-10-CM | POA: Diagnosis not present

## 2024-04-25 DIAGNOSIS — I10 Essential (primary) hypertension: Secondary | ICD-10-CM | POA: Diagnosis not present

## 2024-04-25 LAB — CBC
HCT: 41.1 % (ref 39.0–52.0)
Hemoglobin: 13.7 g/dL (ref 13.0–17.0)
MCH: 28.1 pg (ref 26.0–34.0)
MCHC: 33.3 g/dL (ref 30.0–36.0)
MCV: 84.4 fL (ref 80.0–100.0)
Platelets: 367 K/uL (ref 150–400)
RBC: 4.87 MIL/uL (ref 4.22–5.81)
RDW: 13.4 % (ref 11.5–15.5)
WBC: 11.2 K/uL — ABNORMAL HIGH (ref 4.0–10.5)
nRBC: 0 % (ref 0.0–0.2)

## 2024-04-25 LAB — ECHOCARDIOGRAM COMPLETE
AR max vel: 3.21 cm2
AV Area VTI: 3.09 cm2
AV Area mean vel: 3.08 cm2
AV Mean grad: 4 mmHg
AV Peak grad: 7.5 mmHg
Ao pk vel: 1.37 m/s
Area-P 1/2: 3.53 cm2
Calc EF: 53.3 %
Height: 71 in
S' Lateral: 3.2 cm
Single Plane A2C EF: 56.6 %
Single Plane A4C EF: 49.2 %
Weight: 4003.55 [oz_av]

## 2024-04-25 LAB — BASIC METABOLIC PANEL WITH GFR
Anion gap: 10 (ref 5–15)
BUN: 9 mg/dL (ref 6–20)
CO2: 20 mmol/L — ABNORMAL LOW (ref 22–32)
Calcium: 8.8 mg/dL — ABNORMAL LOW (ref 8.9–10.3)
Chloride: 105 mmol/L (ref 98–111)
Creatinine, Ser: 0.95 mg/dL (ref 0.61–1.24)
GFR, Estimated: >60 mL/min (ref >60)
Glucose, Bld: 163 mg/dL — ABNORMAL HIGH (ref 70–99)
Potassium: 3.5 mmol/L (ref 3.5–5.1)
Sodium: 135 mmol/L (ref 135–145)

## 2024-04-25 LAB — APTT: aPTT: 48 s — ABNORMAL HIGH (ref 24–36)

## 2024-04-25 LAB — MAGNESIUM: Magnesium: 2.2 mg/dL (ref 1.7–2.4)

## 2024-04-25 LAB — HEPARIN LEVEL (UNFRACTIONATED): Heparin Unfractionated: 0.37 [IU]/mL (ref 0.30–0.70)

## 2024-04-25 MED ORDER — CLOPIDOGREL BISULFATE 300 MG PO TABS
300.0000 mg | ORAL_TABLET | Freq: Once | ORAL | Status: AC
  Administered 2024-04-25: 300 mg via ORAL
  Filled 2024-04-25: qty 1

## 2024-04-25 MED ORDER — APIXABAN 5 MG PO TABS
5.0000 mg | ORAL_TABLET | Freq: Two times a day (BID) | ORAL | Status: DC
  Administered 2024-04-25 – 2024-04-26 (×2): 5 mg via ORAL
  Filled 2024-04-25 (×2): qty 1

## 2024-04-25 MED ORDER — POTASSIUM CHLORIDE CRYS ER 20 MEQ PO TBCR
40.0000 meq | EXTENDED_RELEASE_TABLET | Freq: Once | ORAL | Status: AC
  Administered 2024-04-25: 40 meq via ORAL
  Filled 2024-04-25: qty 2

## 2024-04-25 MED ORDER — CHLORHEXIDINE GLUCONATE CLOTH 2 % EX PADS
6.0000 | MEDICATED_PAD | Freq: Every day | CUTANEOUS | Status: DC
  Administered 2024-04-25 – 2024-04-26 (×2): 6 via TOPICAL

## 2024-04-25 MED ORDER — CLOPIDOGREL BISULFATE 75 MG PO TABS
75.0000 mg | ORAL_TABLET | Freq: Every day | ORAL | Status: DC
  Administered 2024-04-26: 75 mg via ORAL
  Filled 2024-04-25: qty 1

## 2024-04-25 NOTE — Progress Notes (Signed)
*  PRELIMINARY RESULTS* Echocardiogram 2D Echocardiogram has been performed.  Keith Case 04/25/2024, 9:21 AM

## 2024-04-25 NOTE — Progress Notes (Signed)
 CARDIAC REHAB PHASE I    Pt resting in bed, feeling well today. Pt eating lunch and would like to walk later today. Will ask RN to walk and evaluate tolerance.  Post MI education including restrictions, risk factors, exercise guidelines, antiplatelet therapy importance, MI booklet, NTG use, heart healthy diet and CRP2 reviewed. All questions and concerns addressed. Will refer to [facility] for CRP2.    Vaughn Asberry Hacking, RN BSN 04/25/2024 11:57 AM

## 2024-04-25 NOTE — Progress Notes (Signed)
 Rounding Note   Patient Name: Keith Case Date of Encounter: 04/25/2024  [facility] Cardiologist: Lonni Cash, MD   Subjective Postop day 1 inferior STEMI currently pain-free.  Scheduled Meds:  aspirin   81 mg Oral Daily   atorvastatin   80 mg Oral Daily   Chlorhexidine  Gluconate Cloth  6 each Topical Daily   clopidogrel   300 mg Oral Once   Followed by   NOREEN ON 04/26/2024] clopidogrel   75 mg Oral Daily   sodium chloride  flush  3 mL Intravenous Q12H   Continuous Infusions:  sodium chloride      heparin  1,450 Units/hr (04/25/24 0800)   PRN Meds: sodium chloride , acetaminophen , morphine  injection, ondansetron  (ZOFRAN ) IV, mouth rinse, oxyCODONE , sodium chloride  flush   Vital Signs  Vitals:   04/25/24 0700 04/25/24 0715 04/25/24 0718 04/25/24 0800  BP: (!) 145/116 (!) 133/105 129/85 (!) 142/102  Pulse: 98 (!) 102 (!) 107 (!) 108  Resp: (!) 25 (!) 21 (!) 21 (!) 27  Temp:      TempSrc:      SpO2: 94% 94% 95% 96%  Weight:      Height:        Intake/Output Summary (Last 24 hours) at 04/25/2024 0809 Last data filed at 04/25/2024 0800 Gross per 24 hour  Intake 618.69 ml  Output 3950 ml  Net -3331.31 ml      04/24/2024    4:45 PM 05/19/2012   12:04 PM 04/13/2012    8:50 AM  Last 3 Weights  Weight (lbs) 250 lb 3.6 oz 281 lb 282 lb  Weight (kg) 113.5 kg 127.461 kg 127.914 kg      Telemetry Sinus rhythm- Personally Reviewed  ECG  Normal sinus rhythm at 91 T wave version in lead III- Personally Reviewed  Physical Exam  GEN: No acute distress.   Neck: No JVD Cardiac: RRR, no murmurs, rubs, or gallops.  Respiratory: Clear to auscultation bilaterally. GI: Soft, nontender, non-distended  MS: No edema; No deformity. Neuro:  Nonfocal  Psych: Normal affect   Labs High Sensitivity Troponin:   Recent Labs  Lab 04/24/24 1733 04/24/24 2004 04/24/24 2221  TROPONINIHS 4,225* 9,538* 19,077*     Chemistry Recent Labs  Lab 04/24/24 1521  04/24/24 1733  NA 138 137  K 3.5 3.8  CL 103 105  CO2  --  24  GLUCOSE 150* 132*  BUN 15 12  CREATININE 0.80 0.97  CALCIUM   --  8.8*  PROT  --  7.0  ALBUMIN  --  3.2*  AST  --  36  ALT  --  29  ALKPHOS  --  74  BILITOT  --  0.5  GFRNONAA  --  >60  ANIONGAP  --  8    Lipids  Recent Labs  Lab 04/24/24 1733  CHOL 150  TRIG 58  HDL 34*  LDLCALC 104*  CHOLHDL 4.4    Hematology Recent Labs  Lab 04/24/24 1521 04/24/24 1733  WBC  --  9.9  RBC  --  5.12  HGB 12.6* 14.5  HCT 37.0* 43.4  MCV  --  84.8  MCH  --  28.3  MCHC  --  33.4  RDW  --  13.0  PLT  --  372   Thyroid No results for input(s): TSH, FREET4 in the last 168 hours.  BNPNo results for input(s): BNP, PROBNP in the last 168 hours.  DDimer No results for input(s): DDIMER in the last 168 hours.   Radiology  CARDIAC CATHETERIZATION  Result Date: 04/24/2024   RPDA lesion is 100% stenosed.   The left ventricular systolic function is normal.   LV end diastolic pressure is normal.   The left ventricular ejection fraction is greater than 65% by visual estimate. Acute inferior STEMI secondary to occlusion of the small caliber PDA. Attempted PCI but unable to cross the lesion due to tortuosity of the distal RCA and acute angle into the PDA with loss of wire control. Pt was pain free and the PDA is seen to fill distally from left to right collaterals so the PCI was aborted. No disease noted in the LAD or Circumflex Normal LVEDP Recommendations: Will continue DAPT with ASA and Brilinta . Since he is on Eliquis  at home, can consider changing Brilinta  to Plavix  before discharge since no stent was placed. I will plan IV heparin  for 24 hours then resume Eliquis . High intensity statin. Echo tomorrow.    Cardiac Studies Cardiac catheterization (21-May-2024)  Conclusion      RPDA lesion is 100% stenosed.   The left ventricular systolic function is normal.   LV end diastolic pressure is normal.   The left ventricular  ejection fraction is greater than 65% by visual estimate.   Acute inferior STEMI secondary to occlusion of the small caliber PDA.  Attempted PCI but unable to cross the lesion due to tortuosity of the distal RCA and acute angle into the PDA with loss of wire control. Pt was pain free and the PDA is seen to fill distally from left to right collaterals so the PCI was aborted.  No disease noted in the LAD or Circumflex Normal LVEDP   Recommendations: Will continue DAPT with ASA and Brilinta . Since he is on Eliquis  at home, can consider changing Brilinta  to Plavix  before discharge since no stent was placed. I will plan IV heparin  for 24 hours then resume Eliquis . High intensity statin. Echo tomorrow.   Coronary Diagrams  Diagnostic Dominance: Right  Intervention    Patient Profile   48 y.o. male married Latino male who presented with anterior STEMI yesterday afternoon.  He was taken to the Cath Lab by Dr. Verlin was unsuccessful and recanalizing an occluded mid PDA and the decision was to treat medically.  Assessment & Plan   1: Inferior STEMI-cardiac cath showed an occluded mid PDA with no other obstructive disease.  This begs the question of whether this was thromboembolic although patient was on Eliquis  for DVT post left total knee replacement several weeks ago.  Dr. Verlin was unable to recanalize the vessel because of tortuosity.  Patient became pain-free at the end of the case with faint left-to-right collaterals.  The plan was medical therapy.  Patient is currently pain-free.  His troponins did go up to 19,000.  LV gram suggested normal LV function by 2D echo was pending.  2: Hyperlipidemia-LDL was 104 not on statin therapy.  I have added high-dose atorvastatin .  3: Hypertension-patient is hypertensive today although no history of hypertension.  This may be transient.  Will wait to begin antihypertensive medications  Postop day 1 aborted inferior STEMI.  Plan is to continue  heparin  for 24 hours, restart DOAC, change Brilinta  to clopidogrel  and treat with quadruple therapy for 30 days after which aspirin  with he discontinued.  Will transfer to a telemetry floor.  Cardiac rehab.  Anticipate discharge tomorrow.     For questions or updates, please contact [facility] Please consult [email or web address] for contact info under     Signed, Dorn Lesches, MD  04/25/2024, 8:09 AM

## 2024-04-25 NOTE — Discharge Instructions (Signed)
 Information on my medicine - ELIQUIS  (apixaban )  This medication education was reviewed with me or my healthcare representative as part of my discharge preparation.   Why was Eliquis  prescribed for you? Eliquis  was prescribed for you to reduce the risk of blood clots forming after orthopedic surgery.    What do You need to know about Eliquis ? Take your Eliquis  TWICE DAILY - one tablet in the morning and one tablet in the evening with or without food.  It would be best to take the dose about the same time each day.  If you have difficulty swallowing the tablet whole please discuss with your pharmacist how to take the medication safely.  Take Eliquis  exactly as prescribed by your doctor and DO NOT stop taking Eliquis  without talking to the doctor who prescribed the medication.  Stopping without other medication to take the place of Eliquis  may increase your risk of developing a clot.  After discharge, you should have regular check-up appointments with your healthcare provider that is prescribing your Eliquis .  What do you do if you miss a dose? If a dose of ELIQUIS  is not taken at the scheduled time, take it as soon as possible on the same day and twice-daily administration should be resumed.  The dose should not be doubled to make up for a missed dose.  Do not take more than one tablet of ELIQUIS  at the same time.  Important Safety Information A possible side effect of Eliquis  is bleeding. You should call your healthcare provider right away if you experience any of the following: Bleeding from an injury or your nose that does not stop. Unusual colored urine (red or dark brown) or unusual colored stools (red or black). Unusual bruising for unknown reasons. A serious fall or if you hit your head (even if there is no bleeding).  Some medicines may interact with Eliquis  and might increase your risk of bleeding or clotting while on Eliquis . To help avoid this, consult your healthcare  provider or pharmacist prior to using any new prescription or non-prescription medications, including herbals, vitamins, non-steroidal anti-inflammatory drugs (NSAIDs) and supplements.  This website has more information on Eliquis  (apixaban ): http://[email or web address] .com/eliquis dena   Information about your medication: Plavix  (anti-platelet agent)  Generic Name (Brand): clopidogrel  (Plavix ), once daily medication  PURPOSE: You are taking this medication along with aspirin  to lower your chance of having a heart attack, stroke, or blood clots in your heart stent. These can be fatal. Plavix  and aspirin  help prevent platelets from sticking together and forming a clot that can block an artery or your stent.   Common SIDE EFFECTS you may experience include: bruising or bleeding more easily, shortness of breath  Do not stop taking PLAVIX  without talking to the doctor who prescribes it for you. People who are treated with a stent and stop taking Plavix  too soon, have a higher risk of getting a blood clot in the stent, having a heart attack, or dying. If you stop Plavix  because of bleeding, or for other reasons, your risk of a heart attack or stroke may increase.   Tell all of your doctors and dentists that you are taking Plavix . They should talk to the doctor who prescribed Plavix  for you before you have any surgery or invasive procedure.   Contact your health care provider if you experience: severe or uncontrollable bleeding, pink/red/brown urine, vomiting blood or vomit that looks like coffee grounds, red or black stools (looks like tar), coughing up blood or blood clots ----------------------------------------------------------------------------------------------------------------------  Information about your medication: Statin (cholesterol-lowering agent)  Generic Name (Brand): atorvastatin  (Lipitor), pravastatin (Pravachol), rosuvastatin (Crestor), simvastatin (Zocor)  PURPOSE: You are taking  this medication to lower your bad cholesterol (LDL) and to prevent heart attacks and strokes. Statins can also raise your good cholesterol (HDL).  Common SIDE EFFECTS you may experience include: muscle pain or weakness (especially in the legs) and upset stomach.  Take your medication exactly as prescribed. Do not eat large amounts of grapefruit or grapefruit juice while taking this medication.  Contact your health care provider if you experience: severe muscle pain that does not improve, dark urine, or yellowing of your skin or eyes. ----------------------------------------------------------------------------------------------------------------------

## 2024-04-25 NOTE — Progress Notes (Signed)
 PHARMACY - ANTICOAGULATION CONSULT NOTE  Pharmacy Consult for heparin  Indication: chest pain/ACS  No Known Allergies  Patient Measurements: Height: 5' 11 (180.3 cm) Weight: 113.5 kg (250 lb 3.6 oz) IBW/kg (Calculated) : 75.3 HEPARIN  DW (KG): 99.9  Vital Signs: Temp: 98.5 F (36.9 C) (08/19 0305) Temp Source: Oral (08/19 0305) BP: 130/95 (08/19 0950) Pulse Rate: 94 (08/19 0950)  Labs: Recent Labs    04/24/24 1521 04/24/24 1733 04/24/24 2004 04/24/24 2221 04/25/24 0903  HGB 12.6* 14.5  --   --  13.7  HCT 37.0* 43.4  --   --  41.1  PLT  --  372  --   --  367  APTT  --  179*  --   --  48*  LABPROT  --  16.3*  --   --   --   INR  --  1.2  --   --   --   HEPARINUNFRC  --   --   --   --  0.37  CREATININE 0.80 0.97  --   --  0.95  TROPONINIHS  --  4,225* 9,538* 19,077*  --     Estimated Creatinine Clearance: 121.9 mL/min (by C-G formula based on SCr of 0.95 mg/dL).   Medical History: Past Medical History:  Diagnosis Date   Chicken pox    DVT (deep venous thrombosis) (HCC)       Assessment: 49 yoM admitted as code STEMI. Unable to cross lesion so pharmacy to start IV heparin  x24h. Heparin  to resume 2h after radial band removed. Per RN, radial band deflated and will be off ~2200. Pt on apixaban  with last dose 8/18 ~1000 so will dose via aPTTs.  8/19 AM: Heparin  level 0.38 (therapeutic) and aPTT 42 sec (subtherapeutic) on heparin  1450 units/hr. Levels not correlating due to recent DOAC administration, will utilize aPTT for dosing. No issues with infusion running or signs of bleeding per RN. CBC stable (Hgb 13.7, PLT 367). Discussed with MD, plan for 24 hours of heparin  then transition back to apixaban .   Goal of Therapy:  Heparin  level 0.3-0.7 units/ml aPTT 66-102 seconds Monitor platelets by anticoagulation protocol: Yes   Plan:  Increase heparin  to 1650 units/h Plan for 24 hours of heparin  (until ~2200 on 8/19) Transition to Eliquis  at 2200 on 8/19  Morna Breach, PharmD PGY2 Cardiology Pharmacy Resident 04/25/2024 10:03 AM

## 2024-04-26 ENCOUNTER — Other Ambulatory Visit (HOSPITAL_COMMUNITY): Payer: Self-pay

## 2024-04-26 ENCOUNTER — Telehealth: Payer: Self-pay | Admitting: Cardiology

## 2024-04-26 DIAGNOSIS — E785 Hyperlipidemia, unspecified: Secondary | ICD-10-CM | POA: Insufficient documentation

## 2024-04-26 DIAGNOSIS — I2119 ST elevation (STEMI) myocardial infarction involving other coronary artery of inferior wall: Secondary | ICD-10-CM | POA: Diagnosis not present

## 2024-04-26 DIAGNOSIS — I82409 Acute embolism and thrombosis of unspecified deep veins of unspecified lower extremity: Secondary | ICD-10-CM | POA: Insufficient documentation

## 2024-04-26 LAB — LIPOPROTEIN A (LPA): Lipoprotein (a): 23.1 nmol/L (ref <75.0)

## 2024-04-26 MED ORDER — CLOPIDOGREL BISULFATE 75 MG PO TABS
75.0000 mg | ORAL_TABLET | Freq: Every day | ORAL | 2 refills | Status: AC
  Filled 2024-04-26: qty 90, 90d supply, fill #0

## 2024-04-26 MED ORDER — ATORVASTATIN CALCIUM 80 MG PO TABS
80.0000 mg | ORAL_TABLET | Freq: Every day | ORAL | 2 refills | Status: AC
  Filled 2024-04-26: qty 90, 90d supply, fill #0

## 2024-04-26 MED ORDER — NITROGLYCERIN 0.4 MG SL SUBL
0.4000 mg | SUBLINGUAL_TABLET | SUBLINGUAL | 2 refills | Status: AC | PRN
  Filled 2024-04-26: qty 25, 8d supply, fill #0

## 2024-04-26 MED ORDER — ASPIRIN 81 MG PO CHEW
81.0000 mg | CHEWABLE_TABLET | Freq: Every day | ORAL | 0 refills | Status: AC
  Filled 2024-04-26: qty 30, 30d supply, fill #0

## 2024-04-26 NOTE — Plan of Care (Signed)
  Problem: Education: Goal: Knowledge of General Education information will improve Description: Including pain rating scale, medication(s)/side effects and non-pharmacologic comfort measures Outcome: Progressing   Problem: Clinical Measurements: Goal: Ability to maintain clinical measurements within normal limits will improve Outcome: Progressing Goal: Will remain free from infection Outcome: Progressing Goal: Diagnostic test results will improve Outcome: Progressing Goal: Respiratory complications will improve Outcome: Progressing Goal: Cardiovascular complication will be avoided Outcome: Progressing   Problem: Nutrition: Goal: Adequate nutrition will be maintained Outcome: Progressing   Problem: Coping: Goal: Level of anxiety will decrease Outcome: Progressing   Problem: Elimination: Goal: Will not experience complications related to bowel motility Outcome: Progressing Goal: Will not experience complications related to urinary retention Outcome: Progressing   Problem: Safety: Goal: Ability to remain free from injury will improve Outcome: Progressing   Problem: Skin Integrity: Goal: Risk for impaired skin integrity will decrease Outcome: Progressing   Problem: Activity: Goal: Ability to return to baseline activity level will improve Outcome: Progressing   Problem: Cardiovascular: Goal: Ability to achieve and maintain adequate cardiovascular perfusion will improve Outcome: Progressing Goal: Vascular access site(s) Level 0-1 will be maintained Outcome: Progressing

## 2024-04-26 NOTE — Telephone Encounter (Signed)
   Transition of Care Follow-up Phone Call Request    Patient Name: Keith Case Date of Birth: Nov 20, 1975 Date of Encounter: 04/26/2024  Primary Care Provider:  [facility] Primary Cardiologist:  Lonni Cash, MD  [name] has been scheduled for a transition of care follow up appointment with a [facility] provider:  Lum Louis 9/3  Please reach out to [name] within 48 hours of discharge to confirm appointment and review transition of care protocol questionnaire. Anticipated discharge date: 8/20  Manuelita Rummer, NP  04/26/2024, 11:57 AM

## 2024-04-26 NOTE — Discharge Summary (Addendum)
 Discharge Summary   Patient ID: Keith Case MRN: [id number]; DOB: 06/01/1976  Admit date: 04/24/2024 Discharge date: 04/26/2024  PCP:  Clinic, [facility] Providers Cardiologist:  Lonni Cash, MD    Discharge Diagnoses  Principal Problem:   Acute ST elevation myocardial infarction (STEMI) of inferior wall [facility]) Active Problems:   DVT (deep venous thrombosis) (HCC)   Hyperlipidemia   Diagnostic Studies/Procedures   Cath: 04/24/2024    RPDA lesion is 100% stenosed.   The left ventricular systolic function is normal.   LV end diastolic pressure is normal.   The left ventricular ejection fraction is greater than 65% by visual estimate.   Acute inferior STEMI secondary to occlusion of the small caliber PDA.  Attempted PCI but unable to cross the lesion due to tortuosity of the distal RCA and acute angle into the PDA with loss of wire control. Pt was pain free and the PDA is seen to fill distally from left to right collaterals so the PCI was aborted.  No disease noted in the LAD or Circumflex Normal LVEDP   Recommendations: Will continue DAPT with ASA and Brilinta . Since he is on Eliquis  at home, can consider changing Brilinta  to Plavix  before discharge since no stent was placed. I will plan IV heparin  for 24 hours then resume Eliquis . High intensity statin. Echo tomorrow.   Diagnostic Dominance: Right   Echocardiogram 04/26/24 IMPRESSIONS     1. Left ventricular ejection fraction, by estimation, is 60 to 65%. The  left ventricle has normal function. The left ventricle demonstrates  regional wall motion abnormalities (see scoring diagram/findings for  description). Left ventricular diastolic  parameters are consistent with Grade I diastolic dysfunction (impaired  relaxation). There is moderate hypokinesis of the left ventricular,  mid-apical inferolateral wall.   2. Right ventricular systolic function is normal. The right ventricular   size is normal. Tricuspid regurgitation signal is inadequate for assessing  PA pressure.   3. The mitral valve is normal in structure. No evidence of mitral valve  regurgitation. No evidence of mitral stenosis.   4. The aortic valve is normal in structure. Aortic valve regurgitation is  not visualized. No aortic stenosis is present.   5. The inferior vena cava is normal in size with greater than 50%  respiratory variability, suggesting right atrial pressure of 3 mmHg.  _____________   History of Present Illness   Keith Case is a 48 y.o. 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 EKG with 1 mm inferior STEMI. Code STEMI called by ED staff. He was taken emergently for cardiac cath.   Hospital Course    Inferior STEMI -- Underwent cardiac catheterization which showed occluded mid PDA with no other obstructive disease.  Attempted intervention though aborted with loss of wire and unable to recannulized vessel secondary to tortuosity.  Given he had collaterals circulation and pain-free recommendations were for medical therapy.  High-sensitivity troponin peaked at [address]. Plan for triple therapy with aspirin , Plavix  and Eliquis  for 30 days and then stop aspirin . -- Echocardiogram showed LVEF of 60 to 65%, grade 1 diastolic dysfunction, moderate hypokinesis of the LV and mid apical inferior lateral wall with normal RV and no significant valvular disease. -- Seen by cardiac rehab -- Continue aspirin , Plavix , Eliquis , atorvastatin  80 mg daily  Hyperlipidemia -- LDL 104, HDL 34, LP(a) 23 --  Continue atorvastatin  80 mg daily -- Will need LFT/FLP in 8 weeks  DVT -- In the setting of recent knee replacement, continue Eliquis  5 mg twice daily  Patient was seen by Dr. Wadie and deemed stable for discharge home.  Follow-up arranged in the office.  Medication sent to [facility]  pharmacy.  Educated by [employer].D. prior to discharge.  Did the patient have an acute coronary syndrome (MI, NSTEMI, STEMI, etc) this admission?:  Yes                               AHA/ACC ACS Clinical Performance & Quality Measures: Aspirin  prescribed? - Yes ADP Receptor Inhibitor (Plavix /Clopidogrel , Brilinta /Ticagrelor  or Effient/Prasugrel) prescribed (includes medically managed patients)? - Yes Beta Blocker prescribed? - No. The patient has an EF >/= 50%. Based upon the [facility] Study pub in Apr 2024, there is no benefit in patients with preserved EF post MI. High Intensity Statin (Lipitor 40-80mg  or Crestor 20-40mg ) prescribed? - Yes EF assessed during THIS hospitalization? - Yes For EF <40%, was ACEI/ARB prescribed? - Not Applicable (EF >/= 40%) For EF <40%, Aldosterone Antagonist (Spironolactone or Eplerenone) prescribed? - Not Applicable (EF >/= 40%) Cardiac Rehab Phase II ordered (including medically managed patients)? - Yes   The patient will be scheduled for a TOC follow up appointment in 10-14 days.  A message has been sent to the [facility] and Scheduling Pool at the office where the patient should be seen for follow up.  _____________  Discharge Vitals Blood pressure 115/78, pulse 98, temperature 98.6 F (37 C), temperature source Oral, resp. rate 18, height 5' 11 (1.803 m), weight 113.5 kg, SpO2 98%.  Filed Weights   04/24/24 1645  Weight: 113.5 kg    Labs & Radiologic Studies  CBC Recent Labs    04/24/24 1733 04/25/24 0903  WBC 9.9 11.2*  NEUTROABS 7.5  --   HGB 14.5 13.7  HCT 43.4 41.1  MCV 84.8 84.4  PLT 372 367   Basic Metabolic Panel Recent Labs    91/81/74 1733 04/25/24 0903  NA 137 135  K 3.8 3.5  CL 105 105  CO2 24 20*  GLUCOSE 132* 163*  BUN 12 9  CREATININE 0.97 0.95  CALCIUM  8.8* 8.8*  MG  --  2.2   Liver Function Tests Recent Labs    04/24/24 1733  AST 36  ALT 29  ALKPHOS 74  BILITOT 0.5  PROT 7.0  ALBUMIN 3.2*   No results for  input(s): LIPASE, AMYLASE in the last 72 hours. High Sensitivity Troponin:   Recent Labs  Lab 04/24/24 1733 04/24/24 2004 04/24/24 2221  TROPONINIHS 4,225* 9,538* 19,077*    No results for input(s): TRNPT in the last 720 hours.  BNP Invalid input(s): POCBNP No results for input(s): PROBNP in the last 72 hours.  No results for input(s): BNP in the last 72 hours.  D-Dimer No results for input(s): DDIMER in the last 72 hours. Hemoglobin A1C Recent Labs    04/24/24 1733  HGBA1C 5.2   Fasting Lipid Panel Recent Labs    04/24/24 1733  CHOL 150  HDL 34*  LDLCALC 104*  TRIG 58  CHOLHDL 4.4   Lipoprotein (a)  Date/Time Value Ref Range Status  04/25/2024 09:03 AM 23.1 <75.0 nmol/L Final    Comment:    (NOTE) This test was developed and its performance characteristics determined by [facility]. It has not been cleared or approved by the  Food and Drug Administration. Note:  Values greater than or equal to 75.0 nmol/L may       indicate an independent risk factor for CHD,       but must be evaluated with caution when applied       to non-Caucasian populations due to the       influence of genetic factors on Lp(a) across       ethnicities. Performed At: [facility] [address] [place], [place] [id number] Jennette Shorter MD Ey:[phone]     Thyroid Function Tests No results for input(s): TSH, T4TOTAL, T3FREE, THYROIDAB in the last 72 hours.  Invalid input(s): FREET3 _____________  ECHOCARDIOGRAM COMPLETE Result Date: 04/25/2024    ECHOCARDIOGRAM REPORT   Patient Name:   Prestyn Lawal Date of Exam: 04/25/2024 Medical Rec #:  [id number]      Height:       71.0 in Accession #:    [id number]     Weight:       250.2 lb Date of Birth:  Apr 13, 1976      BSA:          2.319 m Patient Age:    47 years       BP:           132/86 mmHg Patient Gender: M              HR:           105 bpm. Exam Location:  Inpatient Procedure: 2D Echo, Color Doppler and  Cardiac Doppler (Both Spectral and Color            Flow Doppler were utilized during procedure). Indications:   CAD  History:       Patient has no prior history of Echocardiogram examinations.                STEMI.  Sonographer:   Benard Stallion Referring      [id number] Phys: IMPRESSIONS  1. Left ventricular ejection fraction, by estimation, is 60 to 65%. The left ventricle has normal function. The left ventricle demonstrates regional wall motion abnormalities (see scoring diagram/findings for description). Left ventricular diastolic parameters are consistent with Grade I diastolic dysfunction (impaired relaxation). There is moderate hypokinesis of the left ventricular, mid-apical inferolateral wall.  2. Right ventricular systolic function is normal. The right ventricular size is normal. Tricuspid regurgitation signal is inadequate for assessing PA pressure.  3. The mitral valve is normal in structure. No evidence of mitral valve regurgitation. No evidence of mitral stenosis.  4. The aortic valve is normal in structure. Aortic valve regurgitation is not visualized. No aortic stenosis is present.  5. The inferior vena cava is normal in size with greater than 50% respiratory variability, suggesting right atrial pressure of 3 mmHg. FINDINGS  Left Ventricle: Left ventricular ejection fraction, by estimation, is 60 to 65%. The left ventricle has normal function. The left ventricle demonstrates regional wall motion abnormalities. Moderate hypokinesis of the left ventricular, mid-apical inferolateral wall. The left ventricular internal cavity size was normal in size. There is no left ventricular hypertrophy. Left ventricular diastolic parameters are consistent with Grade I diastolic dysfunction (impaired relaxation). Right Ventricle: The right ventricular size is normal. No increase in right ventricular wall thickness. Right ventricular systolic function is normal. Tricuspid regurgitation signal is  inadequate for assessing PA pressure. Left Atrium: Left atrial size was normal in size. Right Atrium: Right atrial size was normal in size. Pericardium: There is no  evidence of pericardial effusion. Mitral Valve: The mitral valve is normal in structure. No evidence of mitral valve regurgitation. No evidence of mitral valve stenosis. Tricuspid Valve: The tricuspid valve is normal in structure. Tricuspid valve regurgitation is not demonstrated. No evidence of tricuspid stenosis. Aortic Valve: The aortic valve is normal in structure. Aortic valve regurgitation is not visualized. No aortic stenosis is present. Aortic valve mean gradient measures 4.0 mmHg. Aortic valve peak gradient measures 7.5 mmHg. Aortic valve area, by VTI measures 3.09 cm. Pulmonic Valve: The pulmonic valve was normal in structure. Pulmonic valve regurgitation is not visualized. No evidence of pulmonic stenosis. Aorta: The aortic root is normal in size and structure. Venous: The inferior vena cava is normal in size with greater than 50% respiratory variability, suggesting right atrial pressure of 3 mmHg. IAS/Shunts: No atrial level shunt detected by color flow Doppler.  LEFT VENTRICLE PLAX 2D LVIDd:         4.20 cm     Diastology LVIDs:         3.20 cm     LV e' medial:    7.18 cm/s LV PW:         1.10 cm     LV E/e' medial:  6.7 LV IVS:        1.20 cm     LV e' lateral:   7.07 cm/s LVOT diam:     2.30 cm     LV E/e' lateral: 6.8 LV SV:         58 LV SV Index:   25 LVOT Area:     4.15 cm  LV Volumes (MOD) LV vol d, MOD A2C: 77.5 ml LV vol d, MOD A4C: 92.0 ml LV vol s, MOD A2C: 33.6 ml LV vol s, MOD A4C: 46.7 ml LV SV MOD A2C:     43.9 ml LV SV MOD A4C:     92.0 ml LV SV MOD BP:      45.6 ml RIGHT VENTRICLE RV S prime:     12.90 cm/s TAPSE (M-mode): 1.6 cm LEFT ATRIUM             Index        RIGHT ATRIUM           Index LA diam:        2.60 cm 1.12 cm/m   RA Area:     12.40 cm LA Vol (A2C):   33.3 ml 14.36 ml/m  RA Volume:   28.50 ml  12.29  ml/m LA Vol (A4C):   13.5 ml 5.82 ml/m LA Biplane Vol: 22.7 ml 9.79 ml/m  AORTIC VALVE AV Area (Vmax):    3.21 cm AV Area (Vmean):   3.08 cm AV Area (VTI):     3.09 cm AV Vmax:           137.00 cm/s AV Vmean:          90.900 cm/s AV VTI:            0.187 m AV Peak Grad:      7.5 mmHg AV Mean Grad:      4.0 mmHg LVOT Vmax:         106.00 cm/s LVOT Vmean:        67.300 cm/s LVOT VTI:          0.139 m LVOT/AV VTI ratio: 0.74  AORTA Ao Root diam: 3.40 cm MITRAL VALVE MV Area (PHT): 3.53 cm    SHUNTS MV Decel Time: 215  msec    Systemic VTI:  0.14 m MV E velocity: 48.10 cm/s  Systemic Diam: 2.30 cm MV A velocity: 80.40 cm/s MV E/A ratio:  0.60 Mihai Croitoru MD Electronically signed by Jerel Balding MD Signature Date/Time: 04/25/2024/1:18:43 PM    Final    CARDIAC CATHETERIZATION Result Date: 04/24/2024   RPDA lesion is 100% stenosed.   The left ventricular systolic function is normal.   LV end diastolic pressure is normal.   The left ventricular ejection fraction is greater than 65% by visual estimate. Acute inferior STEMI secondary to occlusion of the small caliber PDA. Attempted PCI but unable to cross the lesion due to tortuosity of the distal RCA and acute angle into the PDA with loss of wire control. Pt was pain free and the PDA is seen to fill distally from left to right collaterals so the PCI was aborted. No disease noted in the LAD or Circumflex Normal LVEDP Recommendations: Will continue DAPT with ASA and Brilinta . Since he is on Eliquis  at home, can consider changing Brilinta  to Plavix  before discharge since no stent was placed. I will plan IV heparin  for 24 hours then resume Eliquis . High intensity statin. Echo tomorrow.    Disposition Pt is being discharged home today in good condition.  Follow-up Plans & Appointments  Discharge Instructions     Amb Referral to Cardiac Rehabilitation   Complete by: As directed    Diagnosis: STEMI   After initial evaluation and assessments completed:  Virtual Based Care may be provided alone or in conjunction with Phase 2 Cardiac Rehab based on patient barriers.: Yes   Intensive Cardiac Rehabilitation (ICR) [facility] location only OR Traditional Cardiac Rehabilitation (TCR) *If criteria for ICR are not met will enroll in TCR [facility] only): Yes   Call MD for:  redness, tenderness, or signs of infection (pain, swelling, redness, odor or green/yellow discharge around incision site)   Complete by: As directed    Diet - low sodium heart healthy   Complete by: As directed    Discharge instructions   Complete by: As directed    Radial Site Care Refer to this sheet in the next few weeks. These instructions provide you with information on caring for yourself after your procedure. Your caregiver may also give you more specific instructions. Your treatment has been planned according to current medical practices, but problems sometimes occur. Call your caregiver if you have any problems or questions after your procedure. HOME CARE INSTRUCTIONS You may shower the day after the procedure. Remove the bandage (dressing) and gently wash the site with plain soap and water . Gently pat the site dry.  Do not apply powder or lotion to the site.  Do not submerge the affected site in water  for 3 to 5 days.  Inspect the site at least twice daily.  Do not flex or bend the affected arm for 24 hours.  No lifting over 5 pounds (2.3 kg) for 5 days after your procedure.  Do not drive home if you are discharged the same day of the procedure. Have someone else drive you.  You may drive 24 hours after the procedure unless otherwise instructed by your caregiver.  What to expect: Any bruising will usually fade within 1 to 2 weeks.  Blood that collects in the tissue (hematoma) may be painful to the touch. It should usually decrease in size and tenderness within 1 to 2 weeks.  SEEK IMMEDIATE MEDICAL CARE IF: You have unusual pain at the radial site.  You have redness, warmth, swelling,  or pain at the radial site.  You have drainage (other than a small amount of blood on the dressing).  You have chills.  You have a fever or persistent symptoms for more than 72 hours.  You have a fever and your symptoms suddenly get worse.  Your arm becomes pale, cool, tingly, or numb.  You have heavy bleeding from the site. Hold pressure on the site.   Patients taking blood thinners should generally stay away from medicines like ibuprofen, Advil, Motrin, naproxen, and Aleve due to risk of stomach bleeding. You may take Tylenol  as directed or talk to your primary doctor about alternatives.  PLEASE ENSURE THAT YOU DO NOT RUN OUT OF YOUR PLAVIX . This medication is very important to remain on for at least one year. IF you have issues obtaining this medication due to cost please CALL the office 3-5 business days prior to running out in order to prevent missing doses of this medication.   Increase activity slowly   Complete by: As directed    No wound care   Complete by: As directed        Discharge Medications Allergies as of 04/26/2024   No Known Allergies      Medication List     TAKE these medications    acetaminophen  500 MG tablet Commonly known as: TYLENOL  Take 1,000 mg by mouth every 8 (eight) hours as needed for mild pain (pain score 1-3) or moderate pain (pain score 4-6).   apixaban  5 MG Tabs tablet Commonly known as: ELIQUIS  Take 5 mg by mouth 2 (two) times daily.   aspirin  81 MG chewable tablet Chew 1 tablet (81 mg total) by mouth daily. Start taking on: April 27, 2024   atorvastatin  80 MG tablet Commonly known as: LIPITOR Take 1 tablet (80 mg total) by mouth daily. Start taking on: April 27, 2024   calcium  carbonate 500 MG chewable tablet Commonly known as: TUMS - dosed in mg elemental calcium  Chew 2-3 tablets by mouth as needed for indigestion or heartburn.   clopidogrel  75 MG tablet Commonly known as: PLAVIX  Take 1 tablet (75 mg total) by mouth  daily. Start taking on: April 27, 2024   nitroGLYCERIN  0.4 MG SL tablet Commonly known as: Nitrostat  Place 1 tablet (0.4 mg total) under the tongue every 5 (five) minutes as needed.         Outstanding Labs/Studies  FLP/LFTs in 8 weeks  Duration of Discharge Encounter: APP Time: 15 minutes   Signed, Manuelita Rummer, NP 04/26/2024, 12:05 PM  Agree with note by Manuelita Rummer NP-C  Patient mated for inferior STEMI.  He had been on Eliquis  for DVT several weeks post total knee replacement.  He underwent urgent cath by Dr. Verlin revealing an occluded mid PDA.  He had no other obstructive disease.  This appeared thromboembolic.  Dr. Verlin was unable to recanalize because of tortuosity and the procedure was aborted.  He was Getting some left-to-right collaterals.  His EF was preserved although he did have some inferior wall motion abnormality.  Plan was for treatment with triple therapy for 1 month after which aspirin  will be discontinued.  He is also on high-dose statin therapy and a beta-blocker.  Will arrange TOC 7 after which she will see Dr. Verlin back in the office in follow-up.   Dorn DOROTHA Lesches, M.D., FACP, [facility], LYNITA [facility] [address] [place], [place]  [address]  [phone]  04/26/2024 12:52 PM

## 2024-04-27 NOTE — Telephone Encounter (Signed)
 Patient contacted regarding discharge from [facility] on 04/26/24.  Patient understands to follow up with provider Lum Louis NP on 05/10/24 at 2:45 pm at [facility] location. Patient understands discharge instructions? yes Patient understands medications and regiment? yes Patient understands to bring all medications to this visit? yes

## 2024-04-28 ENCOUNTER — Telehealth (HOSPITAL_COMMUNITY): Payer: Self-pay

## 2024-04-28 ENCOUNTER — Encounter (HOSPITAL_COMMUNITY): Payer: Self-pay

## 2024-04-28 NOTE — Telephone Encounter (Signed)
 Attempted to call patient regarding interest in cardiac rehab- no answer, left message. Mailed letter.

## 2024-05-10 ENCOUNTER — Encounter: Payer: Self-pay | Admitting: Emergency Medicine

## 2024-05-10 ENCOUNTER — Ambulatory Visit: Attending: Emergency Medicine | Admitting: Emergency Medicine

## 2024-05-10 VITALS — BP 128/86 | HR 93 | Ht 71.0 in | Wt 253.0 lb

## 2024-05-10 DIAGNOSIS — I2510 Atherosclerotic heart disease of native coronary artery without angina pectoris: Secondary | ICD-10-CM

## 2024-05-10 DIAGNOSIS — E782 Mixed hyperlipidemia: Secondary | ICD-10-CM

## 2024-05-10 DIAGNOSIS — I2119 ST elevation (STEMI) myocardial infarction involving other coronary artery of inferior wall: Secondary | ICD-10-CM

## 2024-05-10 DIAGNOSIS — I824Y9 Acute embolism and thrombosis of unspecified deep veins of unspecified proximal lower extremity: Secondary | ICD-10-CM | POA: Diagnosis not present

## 2024-05-10 DIAGNOSIS — E785 Hyperlipidemia, unspecified: Secondary | ICD-10-CM | POA: Diagnosis not present

## 2024-05-10 NOTE — Patient Instructions (Addendum)
 Medication Instructions:  STOP TAKING THE ASPIRIN  ON May 25, 2024.  CONTINUE ALL OTHER CURRENT MEDICATION.   Lab Work: Nutritional therapist AND CBC TO BE DONE TODAY.    Testing/Procedures: NONE  Follow-Up: At [facility], you and your health needs are our priority.  As part of our continuing mission to provide you with exceptional heart care, our providers are all part of one team.  This team includes your primary Cardiologist (physician) and Advanced Practice Providers or APPs (Physician Assistants and Nurse Practitioners) who all work together to provide you with the care you need, when you need it.  Your next appointment:   3 MONTHS WITH DR. VERLIN  Provider:   Lonni VERLIN, MD   We recommend signing up for the patient portal called MyChart.  Sign up information is provided on this After Visit Summary.  MyChart is used to connect with patients for Virtual Visits (Telemedicine).  Patients are able to view lab/test results, encounter notes, upcoming appointments, etc.  Non-urgent messages can be sent to your provider as well.   To learn more about what you can do with MyChart, go to [email or web address].

## 2024-05-10 NOTE — Progress Notes (Signed)
 Cardiology Office Note:    Date:  05/10/2024  ID:  Keith Case, DOB 07-21-1976, MRN [id number] PCP: [facility] Providers Cardiologist:  Lonni Cash, MD Cardiology APP:  Rana Lum CROME, NP       Patient Profile:       Chief Complaint: Follow-up STEMI History of Present Illness:  Keith Case is a 48 y.o. 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  He had no prior cardiac history.  Recent history of DVT following left knee replacement on July 2025 now on Eliquis .  EKG with 1 mm inferior STEMI.  Code STEMI was called by ED staff.  Underwent cardiac catheterization which showed occluded mid PDA but no other obstructive disease.  Attempted intervention though aborted with loss of wire and able to recannulized vessel secondary to tortuosity.  Given he had collateral circulation and pain-free recommendations were for medical therapy.  High sensitive troponin peaked at [address].  Plan for triple therapy with aspirin , Plavix , and Eliquis  for 30 days and then stop aspirin .  Echocardiogram showed LVEF 60 to 65%, grade 1 DD, moderate hypokinesis of the LV and mid apical inferior lateral wall with normal RV and no significant valvular disease.   Discussed the use of AI scribe software for clinical note transcription with the patient, who gave verbal consent to proceed.  History of Present Illness Keith Case is a 48 year old male with coronary artery disease who presents for follow-up.  He is accompanied by his wife.  Today patient is doing well overall.  He is without acute cardiovascular concerns or complaints at this time.  Denies any chest pains and denies prior anginal equivalent.  Has returned to limited activity without exertional symptoms.  Denies any dyspnea, orthopnea, PND, leg swelling.  Reports he  feels back to normal. Current medications include Eliquis , Plavix , aspirin , and atorvastatin . Atorvastatin  is used to manage high cholesterol, with an LDL of 104 during hospitalization. He has been aware of his high cholesterol for many years.  No syncope, presyncope, lightheadedness, dizziness.  He works at a [employer] and is considering dietary changes to manage cholesterol levels. He is recovering from knee replacement surgery.     Review of systems:  Please see the history of present illness. All other systems are reviewed and otherwise negative.      Studies Reviewed:    EKG Interpretation Date/Time:  Wednesday May 10 2024 14:21:57 EDT Ventricular Rate:  93 PR Interval:  150 QRS Duration:  92 QT Interval:  358 QTC Calculation: 445 R Axis:   -75  Text Interpretation: Normal sinus rhythm Pulmonary disease pattern Left anterior fascicular block When compared with ECG of 25-Apr-2024 06:50, No significant change was found Confirmed by Rana Lum [phone]) on 05/10/2024 3:01:46 PM    Echocardiogram 04/25/2024  1. Left ventricular ejection fraction, by estimation, is 60 to 65%. The  left ventricle has normal function. The left ventricle demonstrates  regional wall motion abnormalities (see scoring diagram/findings for  description). Left ventricular diastolic  parameters are consistent with Grade I diastolic dysfunction (impaired  relaxation). There is moderate hypokinesis of the left ventricular,  mid-apical inferolateral wall.   2. Right ventricular systolic function is normal. The right ventricular  size is normal. Tricuspid regurgitation signal is inadequate for assessing  PA pressure.   3. The mitral valve is normal in structure.  No evidence of mitral valve  regurgitation. No evidence of mitral stenosis.   4. The aortic valve is normal in structure. Aortic valve regurgitation is  not visualized. No aortic stenosis is present.   5. The inferior vena cava is  normal in size with greater than 50%  respiratory variability, suggesting right atrial pressure of 3 mmHg.   Cardiac catheterization 04/24/2024   RPDA lesion is 100% stenosed.   The left ventricular systolic function is normal.   LV end diastolic pressure is normal.   The left ventricular ejection fraction is greater than 65% by visual estimate.   Acute inferior STEMI secondary to occlusion of the small caliber PDA.  Attempted PCI but unable to cross the lesion due to tortuosity of the distal RCA and acute Case into the PDA with loss of wire control. Pt was pain free and the PDA is seen to fill distally from left to right collaterals so the PCI was aborted.  No disease noted in the LAD or Circumflex Normal LVEDP Diagnostic Dominance: Right  Risk Assessment/Calculations:              Physical Exam:   VS:  BP 128/86 (BP Location: Left Arm, Patient Position: Sitting, Cuff Size: Normal)   Pulse 93   Ht 5' 11 (1.803 m)   Wt 253 lb (114.8 kg)   BMI 35.29 kg/m    Wt Readings from Last 3 Encounters:  05/10/24 253 lb (114.8 kg)  04/24/24 250 lb 3.6 oz (113.5 kg)  05/19/12 281 lb (127.5 kg)    GEN: Well nourished, well developed in no acute distress NECK: No JVD; No carotid bruits CARDIAC: RRR, no murmurs, rubs, gallops RESPIRATORY:  Clear to auscultation without rales, wheezing or rhonchi  ABDOMEN: Soft, non-tender, non-distended EXTREMITIES:  No edema; No acute deformity      Assessment and Plan:  Inferior STEMI Coronary artery disease LHC 8/18 showed occluded mid PDA with no other obstructive disease.  Intervention attempted though aborted with loss of wire unable to recanalize vessel secondary to tortuosity.  Given PDA seen to fill distally from left to right collaterals and pain-free medical therapy was recommended Echocardiogram showed LVEF 60 to 65%, grade 1 DD, moderate hypokinesis of LV and mid apical inferior lateral wall with normal RV and no significant valvular  disease - Right radial cath site is healing appropriately without hematoma or bruising with strong 2+ radial pulse -  EKG today is without acute ischemic changes - Today he is without anginal symptoms.  Has returned to light activity without exertional symptoms.  Denies prior anginal equivalent.  No indication further ischemic evaluation or antianginal therapy at this time - Plan for triple therapy with aspirin , Plavix , and Eliquis  for 30 days and then stop aspirin  (05/25/2024) - Continue atorvastatin  80 mg daily - No BB given preserved EF post MI - BMET and CBC today  Hyperlipidemia LDL 104 on 04/2024 - Started on atorvastatin  during admission - Plan for repeat lipid panel/LFTs x 4 weeks  DVT In the setting of recent knee replacement on 03/2024 - Denies dyspnea and leg pain - Continue Eliquis  5 mg twice daily     Cardiac Rehabilitation Eligibility Assessment  The patient is ready to start cardiac rehabilitation from a cardiac standpoint.     Dispo:  Return in about 3 months (around 08/09/2024).  Signed, Lum LITTIE Louis, NP

## 2024-05-11 ENCOUNTER — Ambulatory Visit: Payer: Self-pay | Admitting: Emergency Medicine

## 2024-05-11 LAB — CBC
Hematocrit: 46.4 % (ref 37.5–51.0)
Hemoglobin: 15 g/dL (ref 13.0–17.7)
MCH: 28.2 pg (ref 26.6–33.0)
MCHC: 32.3 g/dL (ref 31.5–35.7)
MCV: 87 fL (ref 79–97)
Platelets: 276 x10E3/uL (ref 150–450)
RBC: 5.31 x10E6/uL (ref 4.14–5.80)
RDW: 13.7 % (ref 11.6–15.4)
WBC: 10.8 x10E3/uL (ref 3.4–10.8)

## 2024-05-11 LAB — BASIC METABOLIC PANEL WITH GFR
BUN/Creatinine Ratio: 11 (ref 9–20)
BUN: 10 mg/dL (ref 6–24)
CO2: 22 mmol/L (ref 20–29)
Calcium: 9.5 mg/dL (ref 8.7–10.2)
Chloride: 102 mmol/L (ref 96–106)
Creatinine, Ser: 0.94 mg/dL (ref 0.76–1.27)
Glucose: 79 mg/dL (ref 70–99)
Potassium: 3.9 mmol/L (ref 3.5–5.2)
Sodium: 139 mmol/L (ref 134–144)
eGFR: 100 mL/min/1.73 (ref >59)

## 2024-05-12 ENCOUNTER — Telehealth (HOSPITAL_COMMUNITY): Payer: Self-pay

## 2024-05-12 ENCOUNTER — Encounter (HOSPITAL_COMMUNITY): Payer: Self-pay

## 2024-05-12 NOTE — Telephone Encounter (Signed)
 Attempted to call patient to schedule cardiac rehab- no answer, left message. Mailed letter.

## 2024-05-22 ENCOUNTER — Telehealth (HOSPITAL_COMMUNITY): Payer: Self-pay

## 2024-05-22 NOTE — Telephone Encounter (Signed)
 Patient called back to get scheduled in the Cardiac Rehab Program. Patient will come in for orientation on 9/23 and will attend the 6:45 exercise class.  Pensions consultant.

## 2024-05-26 ENCOUNTER — Telehealth (HOSPITAL_COMMUNITY): Payer: Self-pay

## 2024-05-26 NOTE — Telephone Encounter (Signed)
 Attempted to contact pt to confirm cardiac rehab orientation appointment.  Msg left.

## 2024-05-30 ENCOUNTER — Encounter (HOSPITAL_COMMUNITY): Payer: Self-pay

## 2024-05-30 ENCOUNTER — Encounter (HOSPITAL_COMMUNITY)
Admission: RE | Admit: 2024-05-30 | Discharge: 2024-05-30 | Disposition: A | Discharge status: Home / Self Care | Source: Ambulatory Visit | Attending: Cardiovascular Disease | Admitting: Cardiovascular Disease

## 2024-05-30 VITALS — BP 118/82 | HR 94 | Ht 71.75 in | Wt 254.9 lb

## 2024-05-30 DIAGNOSIS — I213 ST elevation (STEMI) myocardial infarction of unspecified site: Secondary | ICD-10-CM | POA: Diagnosis present

## 2024-05-30 HISTORY — DX: Atherosclerotic heart disease of native coronary artery without angina pectoris: I25.10

## 2024-05-30 HISTORY — DX: Hyperlipidemia, unspecified: E78.5

## 2024-05-30 NOTE — Progress Notes (Signed)
 Cardiac Individual Treatment Plan  Patient Details  Name: Keith Case MRN: [id number] Date of Birth: Dec 25, 1975 Referring Provider:   Flowsheet Row INTENSIVE CARDIAC REHAB ORIENT from 05/30/2024 in [facility]  Referring Provider Verlin Lonni BIRCH, MD    Initial Encounter Date:  Flowsheet Row INTENSIVE CARDIAC REHAB ORIENT from 05/30/2024 in [facility]  Date 05/30/24    Visit Diagnosis: 04/24/24 STEMI  Patient's Home Medications on Admission:  Current Outpatient Medications:    acetaminophen  (TYLENOL ) 500 MG tablet, Take 1,000 mg by mouth every 8 (eight) hours as needed for mild pain (pain score 1-3) or moderate pain (pain score 4-6)., Disp: , Rfl:    apixaban  (ELIQUIS ) 5 MG TABS tablet, Take 5 mg by mouth 2 (two) times daily., Disp: , Rfl:    atorvastatin  (LIPITOR) 80 MG tablet, Take 1 tablet (80 mg total) by mouth daily., Disp: 90 tablet, Rfl: 2   clopidogrel  (PLAVIX ) 75 MG tablet, Take 1 tablet (75 mg total) by mouth daily., Disp: 90 tablet, Rfl: 2   nitroGLYCERIN  (NITROSTAT ) 0.4 MG SL tablet, Place 1 tablet (0.4 mg total) under the tongue every 5 (five) minutes as needed., Disp: 25 tablet, Rfl: 2   aspirin  81 MG chewable tablet, Chew 1 tablet (81 mg total) by mouth daily., Disp: 30 tablet, Rfl: 0  Past Medical History: Past Medical History:  Diagnosis Date   Chicken pox    Coronary artery disease    DVT (deep venous thrombosis) (HCC)    Hyperlipidemia     Tobacco Use: Social History   Tobacco Use  Smoking Status Never  Smokeless Tobacco Not on file    Labs: Review Flowsheet       Latest Ref Rng & Units 02/11/2011 03/27/2011 04/24/2024  Labs for ITP Cardiac and Pulmonary Rehab  Cholestrol 0 - 200 mg/dL - 833  849   LDL (calc) 0 - 99 mg/dL - 897  895   HDL-C >59 mg/dL - 30  34   Trlycerides <150 mg/dL - 827  58   Hemoglobin A1c 4.8 - 5.6  % - - 5.2   TCO2 22 - 32 mmol/L 24  - 21     Capillary Blood Glucose: No results found for: GLUCAP   Exercise Target Goals: Exercise Program Goal: Individual exercise prescription set using results from initial 6 min walk test and THRR while considering  patient's activity barriers and safety.   Exercise Prescription Goal: Initial exercise prescription builds to 30-45 minutes a day of aerobic activity, 2-3 days per week.  Home exercise guidelines will be given to patient during program as part of exercise prescription that the participant will acknowledge.  Activity Barriers & Risk Stratification:  Activity Barriers & Cardiac Risk Stratification - 05/30/24 1025       Activity Barriers & Cardiac Risk Stratification   Activity Barriers Balance Concerns;Assistive Device;Left Knee Replacement    Cardiac Risk Stratification High          6 Minute Walk:  6 Minute Walk     Row Name 05/30/24 0939         6 Minute Walk   Phase Initial     Distance 746 feet     Walk Time 6 minutes     # of Rest Breaks 0     MPH 1.41     METS 2.67     RPE 11  Perceived Dyspnea  0     VO2 Peak 9.35     Symptoms No     Resting HR 94 bpm     Resting BP 118/82     Resting Oxygen Saturation  95 %     Exercise Oxygen Saturation  during 6 min walk 97 %     Max Ex. HR 95 bpm     Max Ex. BP 130/90     2 Minute Post BP 118/94        Oxygen Initial Assessment:   Oxygen Re-Evaluation:   Oxygen Discharge (Final Oxygen Re-Evaluation):   Initial Exercise Prescription:  Initial Exercise Prescription - 05/30/24 1000       Date of Initial Exercise RX and Referring Provider   Date 05/30/24    Referring Provider Verlin Lonni BIRCH, MD    Expected Discharge Date 08/23/24      NuStep   Level 1    SPM 85    Minutes 15    METs 2.5      Arm Ergometer   Level 2    Watts 15    RPM 25    Minutes 15    METs 2      Prescription Details   Frequency (times per week) 3     Duration Progress to 30 minutes of continuous aerobic without signs/symptoms of physical distress      Intensity   THRR 40-80% of Max Heartrate 69-138    Ratings of Perceived Exertion 11-13    Perceived Dyspnea 0-4      Progression   Progression Continue to progress workloads to maintain intensity without signs/symptoms of physical distress.      Resistance Training   Training Prescription Yes    Weight 3 lbs    Reps 10-15          Perform Capillary Blood Glucose checks as needed.  Exercise Prescription Changes:   Exercise Comments:   Exercise Goals and Review:   Exercise Goals     Row Name 05/30/24 1025             Exercise Goals   Increase Physical Activity Yes       Intervention Provide advice, education, support and counseling about physical activity/exercise needs.;Develop an individualized exercise prescription for aerobic and resistive training based on initial evaluation findings, risk stratification, comorbidities and participant's personal goals.       Expected Outcomes Short Term: Attend rehab on a regular basis to increase amount of physical activity.;Long Term: Exercising regularly at least 3-5 days a week.;Long Term: Add in home exercise to make exercise part of routine and to increase amount of physical activity.       Increase Strength and Stamina Yes       Intervention Provide advice, education, support and counseling about physical activity/exercise needs.;Develop an individualized exercise prescription for aerobic and resistive training based on initial evaluation findings, risk stratification, comorbidities and participant's personal goals.       Expected Outcomes Short Term: Increase workloads from initial exercise prescription for resistance, speed, and METs.;Short Term: Perform resistance training exercises routinely during rehab and add in resistance training at home;Long Term: Improve cardiorespiratory fitness, muscular endurance and strength as  measured by increased METs and functional capacity ( )       Able to understand and use rate of perceived exertion (RPE) scale Yes       Intervention Provide education and explanation on how to use RPE scale  Expected Outcomes Short Term: Able to use RPE daily in rehab to express subjective intensity level;Long Term:  Able to use RPE to guide intensity level when exercising independently       Knowledge and understanding of Target Heart Rate Range (THRR) Yes       Intervention Provide education and explanation of THRR including how the numbers were predicted and where they are located for reference       Expected Outcomes Short Term: Able to state/look up THRR;Long Term: Able to use THRR to govern intensity when exercising independently;Short Term: Able to use daily as guideline for intensity in rehab       Able to check pulse independently Yes       Intervention Provide education and demonstration on how to check pulse in carotid and radial arteries.;Review the importance of being able to check your own pulse for safety during independent exercise       Expected Outcomes Short Term: Able to explain why pulse checking is important during independent exercise;Long Term: Able to check pulse independently and accurately       Understanding of Exercise Prescription Yes       Intervention Provide education, explanation, and written materials on patient's individual exercise prescription       Expected Outcomes Short Term: Able to explain program exercise prescription;Long Term: Able to explain home exercise prescription to exercise independently          Exercise Goals Re-Evaluation :   Discharge Exercise Prescription (Final Exercise Prescription Changes):   Nutrition:  Target Goals: Understanding of nutrition guidelines, daily intake of sodium 1500mg , cholesterol 200mg , calories 30% from fat and 7% or less from saturated fats, daily to have 5 or more servings of fruits and  vegetables.  Biometrics:  Pre Biometrics - 05/30/24 0801       Pre Biometrics   Waist Circumference 46.5 inches    Hip Circumference 48 inches    Waist to Hip Ratio 0.97 %    Triceps Skinfold 21 mm    % Body Fat 33.5 %    Grip Strength 23 kg    Flexibility --   Not performed. 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   Single Leg Stand 30 seconds           Nutrition Therapy Plan and Nutrition Goals:   Nutrition Assessments:  MEDIFICTS Score Key: >=70 Need to make dietary changes  40-70 Heart Healthy Diet <= 40 Therapeutic Level Cholesterol Diet    Picture Your Plate Scores: <59 Unhealthy dietary pattern with much room for improvement. 41-50 Dietary pattern unlikely to meet recommendations for good health and room for improvement. 51-60 More healthful dietary pattern, with some room for improvement.  >60 Healthy dietary pattern, although there may be some specific behaviors that could be improved.    Nutrition Goals Re-Evaluation:   Nutrition Goals Re-Evaluation:   Nutrition Goals Discharge (Final Nutrition Goals Re-Evaluation):   Psychosocial: Target Goals: Acknowledge presence or absence of significant depression and/or stress, maximize coping skills, provide positive support system. Participant is able to verbalize types and ability to use techniques and skills needed for reducing stress and depression.  Initial Review & Psychosocial Screening:   Quality of Life Scores:  Quality of Life - 05/30/24 0925       Quality of Life   Select Quality of Life      Quality of Life Scores   Health/Function Pre 19.67 %    Socioeconomic Pre 28.29 %    Psych/Spiritual Pre  22.86 %    Family Pre 24 %    GLOBAL Pre 22.74 %         Scores of 19 and below usually indicate a poorer quality of life in these areas.  A difference of  2-3 points is a clinically meaningful difference.  A difference of 2-3 points in the total score of the Quality of Life Index has been associated with  significant improvement in overall quality of life, self-image, physical symptoms, and general health in studies assessing change in quality of life.  PHQ-9: Review Flowsheet       05/30/2024  Depression screen PHQ 2/9  Decreased Interest 0  Down, Depressed, Hopeless 0  PHQ - 2 Score 0  Altered sleeping 0  Tired, decreased energy 0  Change in appetite 0  Feeling bad or failure about yourself  0  Trouble concentrating 0  Moving slowly or fidgety/restless 0  Suicidal thoughts 0  PHQ-9 Score 0  Difficult doing work/chores Not difficult at all   Interpretation of Total Score  Total Score Depression Severity:  1-4 = Minimal depression, 5-9 = Mild depression, 10-14 = Moderate depression, 15-19 = Moderately severe depression, 20-27 = Severe depression   Psychosocial Evaluation and Intervention:   Psychosocial Re-Evaluation:   Psychosocial Discharge (Final Psychosocial Re-Evaluation):   Vocational Rehabilitation: Provide vocational rehab assistance to qualifying candidates.   Vocational Rehab Evaluation & Intervention:  Vocational Rehab - 05/30/24 0848       Initial Vocational Rehab Evaluation & Intervention   Assessment shows need for Vocational Rehabilitation No   Fong works full time he is currently out on short term disability. Nasim does not need vocational rehab at this time         Education: Education Goals: Education classes will be provided on a weekly basis, covering required topics. Participant will state understanding/return demonstration of topics presented.     Core Videos: Exercise    Move It!  Clinical staff conducted group or individual video education with verbal and written material and guidebook.  Patient learns the recommended Pritikin exercise program. Exercise with the goal of living a long, healthy life. Some of the health benefits of exercise include controlled diabetes, healthier blood pressure levels, improved cholesterol levels,  improved heart and lung capacity, improved sleep, and better body composition. Everyone should speak with their doctor before starting or changing an exercise routine.  Biomechanical Limitations Clinical staff conducted group or individual video education with verbal and written material and guidebook.  Patient learns how biomechanical limitations can impact exercise and how we can mitigate and possibly overcome limitations to have an impactful and balanced exercise routine.  Body Composition Clinical staff conducted group or individual video education with verbal and written material and guidebook.  Patient learns that body composition (ratio of muscle mass to fat mass) is a key component to assessing overall fitness, rather than body weight alone. Increased fat mass, especially visceral belly fat, can put us  at increased risk for metabolic syndrome, type 2 diabetes, heart disease, and even death. It is recommended to combine diet and exercise (cardiovascular and resistance training) to improve your body composition. Seek guidance from your physician and exercise physiologist before implementing an exercise routine.  Exercise Action Plan Clinical staff conducted group or individual video education with verbal and written material and guidebook.  Patient learns the recommended strategies to achieve and enjoy long-term exercise adherence, including variety, self-motivation, self-efficacy, and positive decision making. Benefits of exercise include fitness, good health, weight  management, more energy, better sleep, less stress, and overall well-being.  Medical   Heart Disease Risk Reduction Clinical staff conducted group or individual video education with verbal and written material and guidebook.  Patient learns our heart is our most vital organ as it circulates oxygen, nutrients, white blood cells, and hormones throughout the entire body, and carries waste away. Data supports a plant-based eating  plan like the Pritikin Program for its effectiveness in slowing progression of and reversing heart disease. The video provides a number of recommendations to address heart disease.   Metabolic Syndrome and Belly Fat  Clinical staff conducted group or individual video education with verbal and written material and guidebook.  Patient learns what metabolic syndrome is, how it leads to heart disease, and how one can reverse it and keep it from coming back. You have metabolic syndrome if you have 3 of the following 5 criteria: abdominal obesity, high blood pressure, high triglycerides, low HDL cholesterol, and high blood sugar.  Hypertension and Heart Disease Clinical staff conducted group or individual video education with verbal and written material and guidebook.  Patient learns that high blood pressure, or hypertension, is very common in the [place] . Hypertension is largely due to excessive salt intake, but other important risk factors include being overweight, physical inactivity, drinking too much alcohol, smoking, and not eating enough potassium from fruits and vegetables. High blood pressure is a leading risk factor for heart attack, stroke, congestive heart failure, dementia, kidney failure, and premature death. Long-term effects of excessive salt intake include stiffening of the arteries and thickening of heart muscle and organ damage. Recommendations include ways to reduce hypertension and the risk of heart disease.  Diseases of Our Time - Focusing on Diabetes Clinical staff conducted group or individual video education with verbal and written material and guidebook.  Patient learns why the best way to stop diseases of our time is prevention, through food and other lifestyle changes. Medicine (such as prescription pills and surgeries) is often only a Band-Aid on the problem, not a long-term solution. Most common diseases of our time include obesity, type 2 diabetes, hypertension, heart  disease, and cancer. The Pritikin Program is recommended and has been proven to help reduce, reverse, and/or prevent the damaging effects of metabolic syndrome.  Nutrition   Overview of the Pritikin Eating Plan  Clinical staff conducted group or individual video education with verbal and written material and guidebook.  Patient learns about the Pritikin Eating Plan for disease risk reduction. The Pritikin Eating Plan emphasizes a wide variety of unrefined, minimally-processed carbohydrates, like fruits, vegetables, whole grains, and legumes. Go, Caution, and Stop food choices are explained. Plant-based and lean animal proteins are emphasized. Rationale provided for low sodium intake for blood pressure control, low added sugars for blood sugar stabilization, and low added fats and oils for coronary artery disease risk reduction and weight management.  Calorie Density  Clinical staff conducted group or individual video education with verbal and written material and guidebook.  Patient learns about calorie density and how it impacts the Pritikin Eating Plan. Knowing the characteristics of the food you choose will help you decide whether those foods will lead to weight gain or weight loss, and whether you want to consume more or less of them. Weight loss is usually a side effect of the Pritikin Eating Plan because of its focus on low calorie-dense foods.  Label Reading  Clinical staff conducted group or individual video education with verbal and written material and  guidebook.  Patient learns about the Pritikin recommended label reading guidelines and corresponding recommendations regarding calorie density, added sugars, sodium content, and whole grains.  Dining Out - Part 1  Clinical staff conducted group or individual video education with verbal and written material and guidebook.  Patient learns that restaurant meals can be sabotaging because they can be so high in calories, fat, sodium, and/or  sugar. Patient learns recommended strategies on how to positively address this and avoid unhealthy pitfalls.  Facts on Fats  Clinical staff conducted group or individual video education with verbal and written material and guidebook.  Patient learns that lifestyle modifications can be just as effective, if not more so, as many medications for lowering your risk of heart disease. A Pritikin lifestyle can help to reduce your risk of inflammation and atherosclerosis (cholesterol build-up, or plaque, in the artery walls). Lifestyle interventions such as dietary choices and physical activity address the cause of atherosclerosis. A review of the types of fats and their impact on blood cholesterol levels, along with dietary recommendations to reduce fat intake is also included.  Nutrition Action Plan  Clinical staff conducted group or individual video education with verbal and written material and guidebook.  Patient learns how to incorporate Pritikin recommendations into their lifestyle. Recommendations include planning and keeping personal health goals in mind as an important part of their success.  Healthy Mind-Set    Healthy Minds, Bodies, Hearts  Clinical staff conducted group or individual video education with verbal and written material and guidebook.  Patient learns how to identify when they are stressed. Video will discuss the impact of that stress, as well as the many benefits of stress management. Patient will also be introduced to stress management techniques. The way we think, act, and feel has an impact on our hearts.  How Our Thoughts Can Heal Our Hearts  Clinical staff conducted group or individual video education with verbal and written material and guidebook.  Patient learns that negative thoughts can cause depression and anxiety. This can result in negative lifestyle behavior and serious health problems. Cognitive behavioral therapy is an effective method to help control our thoughts in  order to change and improve our emotional outlook.  Additional Videos:  Exercise    Improving Performance  Clinical staff conducted group or individual video education with verbal and written material and guidebook.  Patient learns to use a non-linear approach by alternating intensity levels and lengths of time spent exercising to help burn more calories and lose more body fat. Cardiovascular exercise helps improve heart health, metabolism, hormonal balance, blood sugar control, and recovery from fatigue. Resistance training improves strength, endurance, balance, coordination, reaction time, metabolism, and muscle mass. Flexibility exercise improves circulation, posture, and balance. Seek guidance from your physician and exercise physiologist before implementing an exercise routine and learn your capabilities and proper form for all exercise.  Introduction to Yoga  Clinical staff conducted group or individual video education with verbal and written material and guidebook.  Patient learns about yoga, a discipline of the coming together of mind, breath, and body. The benefits of yoga include improved flexibility, improved range of motion, better posture and core strength, increased lung function, weight loss, and positive self-image. Yoga's heart health benefits include lowered blood pressure, healthier heart rate, decreased cholesterol and triglyceride levels, improved immune function, and reduced stress. Seek guidance from your physician and exercise physiologist before implementing an exercise routine and learn your capabilities and proper form for all exercise.  Medical  Aging: Manufacturing systems engineer of Life  Clinical staff conducted group or individual video education with verbal and written material and guidebook.  Patient learns key strategies and recommendations to stay in good physical health and enhance quality of life, such as prevention strategies, having an advocate, securing a Health  Care Proxy and Power of Attorney, and keeping a list of medications and system for tracking them. It also discusses how to avoid risk for bone loss.  Biology of Weight Control  Clinical staff conducted group or individual video education with verbal and written material and guidebook.  Patient learns that weight gain occurs because we consume more calories than we burn (eating more, moving less). Even if your body weight is normal, you may have higher ratios of fat compared to muscle mass. Too much body fat puts you at increased risk for cardiovascular disease, heart attack, stroke, type 2 diabetes, and obesity-related cancers. In addition to exercise, following the Pritikin Eating Plan can help reduce your risk.  Decoding Lab Results  Clinical staff conducted group or individual video education with verbal and written material and guidebook.  Patient learns that lab test reflects one measurement whose values change over time and are influenced by many factors, including medication, stress, sleep, exercise, food, hydration, pre-existing medical conditions, and more. It is recommended to use the knowledge from this video to become more involved with your lab results and evaluate your numbers to speak with your doctor.   Diseases of Our Time - Overview  Clinical staff conducted group or individual video education with verbal and written material and guidebook.  Patient learns that according to the CDC, 50% to 70% of chronic diseases (such as obesity, type 2 diabetes, elevated lipids, hypertension, and heart disease) are avoidable through lifestyle improvements including healthier food choices, listening to satiety cues, and increased physical activity.  Sleep Disorders Clinical staff conducted group or individual video education with verbal and written material and guidebook.  Patient learns how good quality and duration of sleep are important to overall health and well-being. Patient also learns  about sleep disorders and how they impact health along with recommendations to address them, including discussing with a physician.  Nutrition  Dining Out - Part 2 Clinical staff conducted group or individual video education with verbal and written material and guidebook.  Patient learns how to plan ahead and communicate in order to maximize their dining experience in a healthy and nutritious manner. Included are recommended food choices based on the type of restaurant the patient is visiting.   Fueling a Banker conducted group or individual video education with verbal and written material and guidebook.  There is a strong connection between our food choices and our health. Diseases like obesity and type 2 diabetes are very prevalent and are in large-part due to lifestyle choices. The Pritikin Eating Plan provides plenty of food and hunger-curbing satisfaction. It is easy to follow, affordable, and helps reduce health risks.  Menu Workshop  Clinical staff conducted group or individual video education with verbal and written material and guidebook.  Patient learns that restaurant meals can sabotage health goals because they are often packed with calories, fat, sodium, and sugar. Recommendations include strategies to plan ahead and to communicate with the manager, chef, or server to help order a healthier meal.  Planning Your Eating Strategy  Clinical staff conducted group or individual video education with verbal and written material and guidebook.  Patient learns about the Pritikin Eating Plan  and its benefit of reducing the risk of disease. The Pritikin Eating Plan does not focus on calories. Instead, it emphasizes high-quality, nutrient-rich foods. By knowing the characteristics of the foods, we choose, we can determine their calorie density and make informed decisions.  Targeting Your Nutrition Priorities  Clinical staff conducted group or individual video education with  verbal and written material and guidebook.  Patient learns that lifestyle habits have a tremendous impact on disease risk and progression. This video provides eating and physical activity recommendations based on your personal health goals, such as reducing LDL cholesterol, losing weight, preventing or controlling type 2 diabetes, and reducing high blood pressure.  Vitamins and Minerals  Clinical staff conducted group or individual video education with verbal and written material and guidebook.  Patient learns different ways to obtain key vitamins and minerals, including through a recommended healthy diet. It is important to discuss all supplements you take with your doctor.   Healthy Mind-Set    Smoking Cessation  Clinical staff conducted group or individual video education with verbal and written material and guidebook.  Patient learns that cigarette smoking and tobacco addiction pose a serious health risk which affects millions of people. Stopping smoking will significantly reduce the risk of heart disease, lung disease, and many forms of cancer. Recommended strategies for quitting are covered, including working with your doctor to develop a successful plan.  Culinary   Becoming a Set designer conducted group or individual video education with verbal and written material and guidebook.  Patient learns that cooking at home can be healthy, cost-effective, quick, and puts them in control. Keys to cooking healthy recipes will include looking at your recipe, assessing your equipment needs, planning ahead, making it simple, choosing cost-effective seasonal ingredients, and limiting the use of added fats, salts, and sugars.  Cooking - Breakfast and Snacks  Clinical staff conducted group or individual video education with verbal and written material and guidebook.  Patient learns how important breakfast is to satiety and nutrition through the entire day. Recommendations include key  foods to eat during breakfast to help stabilize blood sugar levels and to prevent overeating at meals later in the day. Planning ahead is also a key component.  Cooking - Educational psychologist conducted group or individual video education with verbal and written material and guidebook.  Patient learns eating strategies to improve overall health, including an approach to cook more at home. Recommendations include thinking of animal protein as a side on your plate rather than center stage and focusing instead on lower calorie dense options like vegetables, fruits, whole grains, and plant-based proteins, such as beans. Making sauces in large quantities to freeze for later and leaving the skin on your vegetables are also recommended to maximize your experience.  Cooking - Healthy Salads and Dressing Clinical staff conducted group or individual video education with verbal and written material and guidebook.  Patient learns that vegetables, fruits, whole grains, and legumes are the foundations of the Pritikin Eating Plan. Recommendations include how to incorporate each of these in flavorful and healthy salads, and how to create homemade salad dressings. Proper handling of ingredients is also covered. Cooking - Soups and [employer] - Soups and Desserts Clinical staff conducted group or individual video education with verbal and written material and guidebook.  Patient learns that Pritikin soups and desserts make for easy, nutritious, and delicious snacks and meal components that are low in sodium, fat, sugar, and calorie density,  while high in vitamins, minerals, and filling fiber. Recommendations include simple and healthy ideas for soups and desserts.   Overview     The Pritikin Solution Program Overview Clinical staff conducted group or individual video education with verbal and written material and guidebook.  Patient learns that the results of the Pritikin Program have been  documented in more than 100 articles published in peer-reviewed journals, and the benefits include reducing risk factors for (and, in some cases, even reversing) high cholesterol, high blood pressure, type 2 diabetes, obesity, and more! An overview of the three key pillars of the Pritikin Program will be covered: eating well, doing regular exercise, and having a healthy mind-set.  WORKSHOPS  Exercise: Exercise Basics: Building Your Action Plan Clinical staff led group instruction and group discussion with PowerPoint presentation and patient guidebook. To enhance the learning environment the use of posters, models and videos may be added. At the conclusion of this workshop, patients will comprehend the difference between physical activity and exercise, as well as the benefits of incorporating both, into their routine. Patients will understand the FITT (Frequency, Intensity, Time, and Type) principle and how to use it to build an exercise action plan. In addition, safety concerns and other considerations for exercise and cardiac rehab will be addressed by the presenter. The purpose of this lesson is to promote a comprehensive and effective weekly exercise routine in order to improve patients' overall level of fitness.   Managing Heart Disease: Your Path to a Healthier Heart Clinical staff led group instruction and group discussion with PowerPoint presentation and patient guidebook. To enhance the learning environment the use of posters, models and videos may be added.At the conclusion of this workshop, patients will understand the anatomy and physiology of the heart. Additionally, they will understand how Pritikin's three pillars impact the risk factors, the progression, and the management of heart disease.  The purpose of this lesson is to provide a high-level overview of the heart, heart disease, and how the Pritikin lifestyle positively impacts risk factors.  Exercise Biomechanics Clinical  staff led group instruction and group discussion with PowerPoint presentation and patient guidebook. To enhance the learning environment the use of posters, models and videos may be added. Patients will learn how the structural parts of their bodies function and how these functions impact their daily activities, movement, and exercise. Patients will learn how to promote a neutral spine, learn how to manage pain, and identify ways to improve their physical movement in order to promote healthy living. The purpose of this lesson is to expose patients to common physical limitations that impact physical activity. Participants will learn practical ways to adapt and manage aches and pains, and to minimize their effect on regular exercise. Patients will learn how to maintain good posture while sitting, walking, and lifting.  Balance Training and Fall Prevention  Clinical staff led group instruction and group discussion with PowerPoint presentation and patient guidebook. To enhance the learning environment the use of posters, models and videos may be added. At the conclusion of this workshop, patients will understand the importance of their sensorimotor skills (vision, proprioception, and the vestibular system) in maintaining their ability to balance as they age. Patients will apply a variety of balancing exercises that are appropriate for their current level of function. Patients will understand the common causes for poor balance, possible solutions to these problems, and ways to modify their physical environment in order to minimize their fall risk. The purpose of this lesson is to teach  patients about the importance of maintaining balance as they age and ways to minimize their risk of falling.  WORKSHOPS   Nutrition:  Fueling a Ship broker led group instruction and group discussion with PowerPoint presentation and patient guidebook. To enhance the learning environment the use of  posters, models and videos may be added. Patients will review the foundational principles of the Pritikin Eating Plan and understand what constitutes a serving size in each of the food groups. Patients will also learn Pritikin-friendly foods that are better choices when away from home and review make-ahead meal and snack options. Calorie density will be reviewed and applied to three nutrition priorities: weight maintenance, weight loss, and weight gain. The purpose of this lesson is to reinforce (in a group setting) the key concepts around what patients are recommended to eat and how to apply these guidelines when away from home by planning and selecting Pritikin-friendly options. Patients will understand how calorie density may be adjusted for different weight management goals.  Mindful Eating  Clinical staff led group instruction and group discussion with PowerPoint presentation and patient guidebook. To enhance the learning environment the use of posters, models and videos may be added. Patients will briefly review the concepts of the Pritikin Eating Plan and the importance of low-calorie dense foods. The concept of mindful eating will be introduced as well as the importance of paying attention to internal hunger signals. Triggers for non-hunger eating and techniques for dealing with triggers will be explored. The purpose of this lesson is to provide patients with the opportunity to review the basic principles of the Pritikin Eating Plan, discuss the value of eating mindfully and how to measure internal cues of hunger and fullness using the Hunger Scale. Patients will also discuss reasons for non-hunger eating and learn strategies to use for controlling emotional eating.  Targeting Your Nutrition Priorities Clinical staff led group instruction and group discussion with PowerPoint presentation and patient guidebook. To enhance the learning environment the use of posters, models and videos may be added.  Patients will learn how to determine their genetic susceptibility to disease by reviewing their family history. Patients will gain insight into the importance of diet as part of an overall healthy lifestyle in mitigating the impact of genetics and other environmental insults. The purpose of this lesson is to provide patients with the opportunity to assess their personal nutrition priorities by looking at their family history, their own health history and current risk factors. Patients will also be able to discuss ways of prioritizing and modifying the Pritikin Eating Plan for their highest risk areas  Menu  Clinical staff led group instruction and group discussion with PowerPoint presentation and patient guidebook. To enhance the learning environment the use of posters, models and videos may be added. Using menus brought in from [employer], or printed from [employer], patients will apply the Pritikin dining out guidelines that were presented in the [employer] video. Patients will also be able to practice these guidelines in a variety of provided scenarios. The purpose of this lesson is to provide patients with the opportunity to practice hands-on learning of the Pritikin Dining Out guidelines with actual menus and practice scenarios.  Label Reading Clinical staff led group instruction and group discussion with PowerPoint presentation and patient guidebook. To enhance the learning environment the use of posters, models and videos may be added. Patients will review and discuss the Pritikin label reading guidelines presented in Pritikin's Label Reading Educational series  video. Using fool labels brought in from local grocery stores and markets, patients will apply the label reading guidelines and determine if the packaged food meet the Pritikin guidelines. The purpose of this lesson is to provide patients with the opportunity to review, discuss, and practice hands-on learning of the  Pritikin Label Reading guidelines with actual packaged food labels. Cooking School  Pritikin's [employer] are designed to teach patients ways to prepare quick, simple, and affordable recipes at home. The importance of nutrition's role in chronic disease risk reduction is reflected in its emphasis in the overall Pritikin program. By learning how to prepare essential core Pritikin Eating Plan recipes, patients will increase control over what they eat; be able to customize the flavor of foods without the use of added salt, sugar, or fat; and improve the quality of the food they consume. By learning a set of core recipes which are easily assembled, quickly prepared, and affordable, patients are more likely to prepare more healthy foods at home. These workshops focus on convenient breakfasts, simple entres, side dishes, and desserts which can be prepared with minimal effort and are consistent with nutrition recommendations for cardiovascular risk reduction. Cooking [employer] are taught by a Armed forces logistics/support/administrative officer (RD) who has been trained by the [employer]. The chef or RD has a clear understanding of the importance of minimizing - if not completely eliminating - added fat, sugar, and sodium in recipes. Throughout the series of Cooking School Workshop sessions, patients will learn about healthy ingredients and efficient methods of cooking to build confidence in their capability to prepare    Cooking School weekly topics:  Adding Flavor- Sodium-Free  Fast and Healthy Breakfasts  Powerhouse Plant-Based Proteins  Satisfying Salads and Dressings  Simple Sides and Sauces  International Cuisine-Spotlight on the [employer] Zones  Delicious Desserts  Savory Soups  [employer] - Meals in a Astronomer Appetizers and Snacks  Comforting Weekend Breakfasts  One-Pot Wonders   Fast Evening Meals  Landscape architect Your Pritikin Plate  WORKSHOPS    Healthy Mindset (Psychosocial):  Focused Goals, Sustainable Changes Clinical staff led group instruction and group discussion with PowerPoint presentation and patient guidebook. To enhance the learning environment the use of posters, models and videos may be added. Patients will be able to apply effective goal setting strategies to establish at least one personal goal, and then take consistent, meaningful action toward that goal. They will learn to identify common barriers to achieving personal goals and develop strategies to overcome them. Patients will also gain an understanding of how our mind-set can impact our ability to achieve goals and the importance of cultivating a positive and growth-oriented mind-set. The purpose of this lesson is to provide patients with a deeper understanding of how to set and achieve personal goals, as well as the tools and strategies needed to overcome common obstacles which may arise along the way.  From Head to Heart: The Power of a Healthy Outlook  Clinical staff led group instruction and group discussion with PowerPoint presentation and patient guidebook. To enhance the learning environment the use of posters, models and videos may be added. Patients will be able to recognize and describe the impact of emotions and mood on physical health. They will discover the importance of self-care and explore self-care practices which may work for them. Patients will also learn how to utilize the 4 C's to cultivate a healthier outlook and better manage stress and challenges. The  purpose of this lesson is to demonstrate to patients how a healthy outlook is an essential part of maintaining good health, especially as they continue their cardiac rehab journey.  Healthy Sleep for a Healthy Heart Clinical staff led group instruction and group discussion with PowerPoint presentation and patient guidebook. To enhance the learning environment the use of posters, models and videos may be  added. At the conclusion of this workshop, patients will be able to demonstrate knowledge of the importance of sleep to overall health, well-being, and quality of life. They will understand the symptoms of, and treatments for, common sleep disorders. Patients will also be able to identify daytime and nighttime behaviors which impact sleep, and they will be able to apply these tools to help manage sleep-related challenges. The purpose of this lesson is to provide patients with a general overview of sleep and outline the importance of quality sleep. Patients will learn about a few of the most common sleep disorders. Patients will also be introduced to the concept of "sleep hygiene," and discover ways to self-manage certain sleeping problems through simple daily behavior changes. Finally, the workshop will motivate patients by clarifying the links between quality sleep and their goals of heart-healthy living.   Recognizing and Reducing Stress Clinical staff led group instruction and group discussion with PowerPoint presentation and patient guidebook. To enhance the learning environment the use of posters, models and videos may be added. At the conclusion of this workshop, patients will be able to understand the types of stress reactions, differentiate between acute and chronic stress, and recognize the impact that chronic stress has on their health. They will also be able to apply different coping mechanisms, such as reframing negative self-talk. Patients will have the opportunity to practice a variety of stress management techniques, such as deep abdominal breathing, progressive muscle relaxation, and/or guided imagery.  The purpose of this lesson is to educate patients on the role of stress in their lives and to provide healthy techniques for coping with it.  Learning Barriers/Preferences:  Learning Barriers/Preferences - 05/30/24 0850       Learning Barriers/Preferences   Learning Barriers Exercise  Concerns;Sight    Learning Preferences Pictoral;Video          Education Topics:  Knowledge Questionnaire Score:  Knowledge Questionnaire Score - 05/30/24 0854       Knowledge Questionnaire Score   Pre Score 22/24          Core Components/Risk Factors/Patient Goals at Admission:  Personal Goals and Risk Factors at Admission - 05/30/24 0804       Core Components/Risk Factors/Patient Goals on Admission    Weight Management Yes;Obesity;Weight Loss    Intervention Weight Management/Obesity: Establish reasonable short term and long term weight goals.;Obesity: Provide education and appropriate resources to help participant work on and attain dietary goals.    Admit Weight 254 lb 13.6 oz (115.6 kg)    Expected Outcomes Short Term: Continue to assess and modify interventions until short term weight is achieved;Long Term: Adherence to nutrition and physical activity/exercise program aimed toward attainment of established weight goal;Weight Loss: Understanding of general recommendations for a balanced deficit meal plan, which promotes 1-2 lb weight loss per week and includes a negative energy balance of [phone] kcal/d    Lipids Yes    Intervention Provide education and support for participant on nutrition & aerobic/resistive exercise along with prescribed medications to achieve LDL 70mg , HDL >40mg .    Expected Outcomes Short Term: Participant states understanding of desired cholesterol  values and is compliant with medications prescribed. Participant is following exercise prescription and nutrition guidelines.;Long Term: Cholesterol controlled with medications as prescribed, with individualized exercise RX and with personalized nutrition plan. Value goals: LDL < 70mg , HDL > 40 mg.    Stress Yes    Intervention Offer individual and/or small group education and counseling on adjustment to heart disease, stress management and health-related lifestyle change. Teach and support self-help  strategies.;Refer participants experiencing significant psychosocial distress to appropriate mental health specialists for further evaluation and treatment. When possible, include family members and significant others in education/counseling sessions.    Expected Outcomes Short Term: Participant demonstrates changes in health-related behavior, relaxation and other stress management skills, ability to obtain effective social support, and compliance with psychotropic medications if prescribed.;Long Term: Emotional wellbeing is indicated by absence of clinically significant psychosocial distress or social isolation.          Core Components/Risk Factors/Patient Goals Review:    Core Components/Risk Factors/Patient Goals at Discharge (Final Review):    ITP Comments:  ITP Comments     Row Name 05/30/24 0801           ITP Comments Medical Director- Dr. Wilbert Bihari, MD. Introduction to the Pritikin Education/ Intensive Cardiac Rehab Program. Reviewed initial orientation folder with [facility].          Comments: Dennise attended orientation for the cardiac rehabilitation program on  05/30/2024  to perform initial intake and exercise walk test. He was introduced to the [employer] education and orientation packet was reviewed. Completed 6-minute walk test, measurements, initial ITP, and exercise prescription. Vital signs stable. Telemetry-normal sinus rhythm, asymptomatic.   Service time was from 801 to 953. Arnoldo CHRISTELLA Gal, MS, ACSM CEP 05/30/2024 1030

## 2024-05-30 NOTE — Progress Notes (Signed)
 Cardiac Rehab Medication Review by a Nurse  Does the patient  feel that his/her medications are working for him/her?  yes  Has the patient been experiencing any side effects to the medications prescribed?  no  Does the patient measure his/her own blood pressure or blood glucose at home?  yes   Does the patient have any problems obtaining medications due to transportation or finances?   no  Understanding of regimen: excellent Understanding of indications: excellent Potential of compliance: excellent    Nurse comments: Keith Case is taking his medications as prescribed and has a good understanding of what his medications are for. Keith Case has a BP cuff/ monitor. Keith Case does not take his BP on a regular basis.    Hadassah Gaw [facility] RN 05/30/2024 8:42 AM

## 2024-06-05 ENCOUNTER — Encounter (HOSPITAL_COMMUNITY)
Admission: RE | Admit: 2024-06-05 | Discharge: 2024-06-05 | Disposition: A | Discharge status: Home / Self Care | Source: Ambulatory Visit | Attending: Cardiovascular Disease | Admitting: Cardiovascular Disease

## 2024-06-05 DIAGNOSIS — I213 ST elevation (STEMI) myocardial infarction of unspecified site: Secondary | ICD-10-CM

## 2024-06-05 NOTE — Progress Notes (Signed)
 Daily Session Note  Patient Details  Name: Keith Case MRN: [id number] Date of Birth: Jun 08, 1976 Referring Provider:   Flowsheet Row INTENSIVE CARDIAC REHAB ORIENT from 05/30/2024 in [facility]  Referring Provider Verlin Lonni BIRCH, MD    Encounter Date: 06/05/2024  Check In:  Session Check In - 06/05/24 0710       Check-In   Supervising physician immediately available to respond to emergencies [facility] MD immediately available    Physician(s) Rosaline Bane, NP    Location [facility]    Staff Present Con Pereyra, MS, Exercise Physiologist;Johnny Fayette, MS, Exercise Physiologist;Arriah Wadle Lennon, RN, Maud Moats, MS, ACSM-CEP, Exercise Physiologist;Jetta Walker BS, ACSM-CEP, Exercise Physiologist    Virtual Visit No    Medication changes reported     No    Fall or balance concerns reported    No    Tobacco Cessation No Change    Warm-up and Cool-down Performed as group-led instruction    Resistance Training Performed Yes    VAD Patient? No    PAD/SET Patient? No      Pain Assessment   Currently in Pain? No/denies    Pain Score 0-No pain    Multiple Pain Sites No          Capillary Blood Glucose: No results found for this or any previous visit (from the past 24 hours).   Exercise Prescription Changes - 06/05/24 0834       Response to Exercise   Blood Pressure (Admit) 120/94    Blood Pressure (Exercise) 124/88    Blood Pressure (Exit) 132/86    Heart Rate (Admit) 82 bpm    Heart Rate (Exercise) 98 bpm    Heart Rate (Exit) 91 bpm    Rating of Perceived Exertion (Exercise) 9    Perceived Dyspnea (Exercise) 0    Symptoms none    Comments Pt first day in the [facility] program    Duration Progress to 30 minutes of  aerobic without signs/symptoms of physical distress    Intensity THRR unchanged      Progression   Progression Continue to progress workloads to maintain  intensity without signs/symptoms of physical distress.    Average METs 1.9      Resistance Training   Training Prescription Yes    Weight 3 lbs    Reps 10-15    Time 10 Minutes      NuStep   Level 1    SPM 56    Minutes 15    METs 1.6      Arm Ergometer   Level 2    Watts 16    RPM 45    Minutes 15    METs 1.6          Social History   Tobacco Use  Smoking Status Never  Smokeless Tobacco Not on file    Goals Met:  Exercise tolerated well No report of concerns or symptoms today Strength training completed today  Goals Unmet:  Not Applicable  Comments: Pt started cardiac rehab today.  Pt tolerated light exercise without difficulty. VSS, telemetry- normal sinus rhythm, asymptomatic.  Medication list reconciled. Pt denies barriers to medication compliance.  PSYCHOSOCIAL ASSESSMENT:  PHQ-0. Pt exhibits positive coping skills, hopeful outlook with supportive family. No psychosocial needs identified at this time, no psychosocial interventions necessary.    Pt enjoys reading and watching TV.   Pt oriented to exercise equipment and routine.  Understanding verbalized.     Dr. Wilbert Bihari is Medical Director for Cardiac Rehab at [facility].

## 2024-06-07 ENCOUNTER — Encounter (HOSPITAL_COMMUNITY)
Admission: RE | Admit: 2024-06-07 | Discharge: 2024-06-07 | Disposition: A | Discharge status: Home / Self Care | Source: Ambulatory Visit | Attending: Cardiovascular Disease | Admitting: Cardiovascular Disease

## 2024-06-07 DIAGNOSIS — I213 ST elevation (STEMI) myocardial infarction of unspecified site: Secondary | ICD-10-CM | POA: Insufficient documentation

## 2024-06-09 ENCOUNTER — Encounter (HOSPITAL_COMMUNITY)
Admission: RE | Admit: 2024-06-09 | Discharge: 2024-06-09 | Disposition: A | Discharge status: Home / Self Care | Source: Ambulatory Visit | Attending: Cardiovascular Disease | Admitting: Cardiovascular Disease

## 2024-06-09 DIAGNOSIS — I213 ST elevation (STEMI) myocardial infarction of unspecified site: Secondary | ICD-10-CM

## 2024-06-12 ENCOUNTER — Encounter (HOSPITAL_COMMUNITY)
Admission: RE | Admit: 2024-06-12 | Discharge: 2024-06-12 | Disposition: A | Discharge status: Home / Self Care | Source: Ambulatory Visit | Attending: Cardiovascular Disease | Admitting: Cardiovascular Disease

## 2024-06-12 DIAGNOSIS — I213 ST elevation (STEMI) myocardial infarction of unspecified site: Secondary | ICD-10-CM

## 2024-06-13 NOTE — Progress Notes (Signed)
 Cardiac Individual Treatment Plan  Patient Details  Name: Keith Case MRN: [id number] Date of Birth: 11-Nov-1975 Referring Provider:   Flowsheet Row INTENSIVE CARDIAC REHAB ORIENT from 05/30/2024 in [facility]  Referring Provider Verlin Lonni BIRCH, MD    Initial Encounter Date:  Flowsheet Row INTENSIVE CARDIAC REHAB ORIENT from 05/30/2024 in [facility]  Date 05/30/24    Visit Diagnosis: 04/24/24 STEMI  Patient's Home Medications on Admission:  Current Outpatient Medications:    acetaminophen  (TYLENOL ) 500 MG tablet, Take 1,000 mg by mouth every 8 (eight) hours as needed for mild pain (pain score 1-3) or moderate pain (pain score 4-6)., Disp: , Rfl:    apixaban  (ELIQUIS ) 5 MG TABS tablet, Take 5 mg by mouth 2 (two) times daily., Disp: , Rfl:    aspirin  81 MG chewable tablet, Chew 1 tablet (81 mg total) by mouth daily., Disp: 30 tablet, Rfl: 0   atorvastatin  (LIPITOR) 80 MG tablet, Take 1 tablet (80 mg total) by mouth daily., Disp: 90 tablet, Rfl: 2   clopidogrel  (PLAVIX ) 75 MG tablet, Take 1 tablet (75 mg total) by mouth daily., Disp: 90 tablet, Rfl: 2   nitroGLYCERIN  (NITROSTAT ) 0.4 MG SL tablet, Place 1 tablet (0.4 mg total) under the tongue every 5 (five) minutes as needed., Disp: 25 tablet, Rfl: 2  Past Medical History: Past Medical History:  Diagnosis Date   Chicken pox    Coronary artery disease    DVT (deep venous thrombosis) (HCC)    Hyperlipidemia     Tobacco Use: Social History   Tobacco Use  Smoking Status Never  Smokeless Tobacco Not on file    Labs: Review Flowsheet       Latest Ref Rng & Units 02/11/2011 03/27/2011 04/24/2024  Labs for ITP Cardiac and Pulmonary Rehab  Cholestrol 0 - 200 mg/dL - 833  849   LDL (calc) 0 - 99 mg/dL - 897  895   HDL-C >59 mg/dL - 30  34   Trlycerides <150 mg/dL - 827  58   Hemoglobin A1c 4.8 - 5.6  % - - 5.2   TCO2 22 - 32 mmol/L 24  - 21      Exercise Target Goals: Exercise Program Goal: Individual exercise prescription set using results from initial 6 min walk test and THRR while considering  patient's activity barriers and safety.   Exercise Prescription Goal: Initial exercise prescription builds to 30-45 minutes a day of aerobic activity, 2-3 days per week.  Home exercise guidelines will be given to patient during program as part of exercise prescription that the participant will acknowledge.   Education: Aerobic Exercise: - Group verbal and visual presentation on the components of exercise prescription. Introduces F.I.T.T principle from ACSM for exercise prescriptions.  Reviews F.I.T.T. principles of aerobic exercise including progression. Written material provided at class time.   Education: Resistance Exercise: - Group verbal and visual presentation on the components of exercise prescription. Introduces F.I.T.T principle from ACSM for exercise prescriptions  Reviews F.I.T.T. principles of resistance exercise including progression. Written material provided at class time.    Education: Exercise & Equipment Safety: - Individual verbal instruction and demonstration of equipment use and safety with use of the equipment.   Education: Exercise Physiology & General Exercise Guidelines: - Group verbal and written instruction with models to review the exercise physiology of the cardiovascular system and associated critical values. Provides general exercise  guidelines with specific guidelines to those with heart or lung disease. Written material provided at class time.   Education: Flexibility, Balance, Mind/Body Relaxation: - Group verbal and visual presentation with interactive activity on the components of exercise prescription. Introduces F.I.T.T principle from ACSM for exercise prescriptions. Reviews F.I.T.T. principles of flexibility and balance exercise training including  progression. Also discusses the mind body connection.  Reviews various relaxation techniques to help reduce and manage stress (i.e. Deep breathing, progressive muscle relaxation, and visualization). Balance handout provided to take home. Written material provided at class time.   Activity Barriers & Risk Stratification:  Activity Barriers & Cardiac Risk Stratification - 05/30/24 1025       Activity Barriers & Cardiac Risk Stratification   Activity Barriers Balance Concerns;Assistive Device;Left Knee Replacement    Cardiac Risk Stratification High          6 Minute Walk:  6 Minute Walk     Row Name 05/30/24 0939         6 Minute Walk   Phase Initial     Distance 746 feet     Walk Time 6 minutes     # of Rest Breaks 0     MPH 1.41     METS 2.67     RPE 11     Perceived Dyspnea  0     VO2 Peak 9.35     Symptoms No     Resting HR 94 bpm     Resting BP 118/82     Resting Oxygen Saturation  95 %     Exercise Oxygen Saturation  during 6 min walk 97 %     Max Ex. HR 95 bpm     Max Ex. BP 130/90     2 Minute Post BP 118/94        Oxygen Initial Assessment:   Oxygen Re-Evaluation:   Oxygen Discharge (Final Oxygen Re-Evaluation):   Initial Exercise Prescription:  Initial Exercise Prescription - 05/30/24 1000       Date of Initial Exercise RX and Referring Provider   Date 05/30/24    Referring Provider Verlin Lonni BIRCH, MD    Expected Discharge Date 08/23/24      NuStep   Level 1    SPM 85    Minutes 15    METs 2.5      Arm Ergometer   Level 2    Watts 15    RPM 25    Minutes 15    METs 2      Prescription Details   Frequency (times per week) 3    Duration Progress to 30 minutes of continuous aerobic without signs/symptoms of physical distress      Intensity   THRR 40-80% of Max Heartrate 69-138    Ratings of Perceived Exertion 11-13    Perceived Dyspnea 0-4      Progression   Progression Continue to progress workloads to maintain  intensity without signs/symptoms of physical distress.      Resistance Training   Training Prescription Yes    Weight 3 lbs    Reps 10-15          Perform Capillary Blood Glucose checks as needed.  Exercise Prescription Changes:   Exercise Prescription Changes     Row Name 06/05/24 0834             Response to Exercise   Blood Pressure (Admit) 120/94       Blood Pressure (Exercise) 124/88  Blood Pressure (Exit) 132/86       Heart Rate (Admit) 82 bpm       Heart Rate (Exercise) 98 bpm       Heart Rate (Exit) 91 bpm       Rating of Perceived Exertion (Exercise) 9       Perceived Dyspnea (Exercise) 0       Symptoms none       Comments Pt first day in the [facility] program       Duration Progress to 30 minutes of  aerobic without signs/symptoms of physical distress       Intensity THRR unchanged         Progression   Progression Continue to progress workloads to maintain intensity without signs/symptoms of physical distress.       Average METs 1.9         Resistance Training   Training Prescription Yes       Weight 3 lbs       Reps 10-15       Time 10 Minutes         NuStep   Level 1       SPM 56       Minutes 15       METs 1.6         Arm Ergometer   Level 2       Watts 16       RPM 45       Minutes 15       METs 1.6          Exercise Comments:   Exercise Comments     Row Name 06/05/24 0839           Exercise Comments Pt first day in the [facility] program. Pt tolerated exercise well with an average MET level of 1.6. He is off to a good start and is learning his THRR, RPE and ExRx          Exercise Goals and Review:   Exercise Goals     Row Name 05/30/24 1025             Exercise Goals   Increase Physical Activity Yes       Intervention Provide advice, education, support and counseling about physical activity/exercise needs.;Develop an individualized exercise prescription for aerobic and resistive training based on initial  evaluation findings, risk stratification, comorbidities and participant's personal goals.       Expected Outcomes Short Term: Attend rehab on a regular basis to increase amount of physical activity.;Long Term: Exercising regularly at least 3-5 days a week.;Long Term: Add in home exercise to make exercise part of routine and to increase amount of physical activity.       Increase Strength and Stamina Yes       Intervention Provide advice, education, support and counseling about physical activity/exercise needs.;Develop an individualized exercise prescription for aerobic and resistive training based on initial evaluation findings, risk stratification, comorbidities and participant's personal goals.       Expected Outcomes Short Term: Increase workloads from initial exercise prescription for resistance, speed, and METs.;Short Term: Perform resistance training exercises routinely during rehab and add in resistance training at home;Long Term: Improve cardiorespiratory fitness, muscular endurance and strength as measured by increased METs and functional capacity ( )       Able to understand and use rate of perceived exertion (RPE) scale Yes       Intervention Provide education and explanation  on how to use RPE scale       Expected Outcomes Short Term: Able to use RPE daily in rehab to express subjective intensity level;Long Term:  Able to use RPE to guide intensity level when exercising independently       Knowledge and understanding of Target Heart Rate Range (THRR) Yes       Intervention Provide education and explanation of THRR including how the numbers were predicted and where they are located for reference       Expected Outcomes Short Term: Able to state/look up THRR;Long Term: Able to use THRR to govern intensity when exercising independently;Short Term: Able to use daily as guideline for intensity in rehab       Able to check pulse independently Yes       Intervention Provide education and  demonstration on how to check pulse in carotid and radial arteries.;Review the importance of being able to check your own pulse for safety during independent exercise       Expected Outcomes Short Term: Able to explain why pulse checking is important during independent exercise;Long Term: Able to check pulse independently and accurately       Understanding of Exercise Prescription Yes       Intervention Provide education, explanation, and written materials on patient's individual exercise prescription       Expected Outcomes Short Term: Able to explain program exercise prescription;Long Term: Able to explain home exercise prescription to exercise independently          Exercise Goals Re-Evaluation :  Exercise Goals Re-Evaluation     Row Name 06/05/24 0837             Exercise Goal Re-Evaluation   Exercise Goals Review Increase Physical Activity;Understanding of Exercise Prescription;Increase Strength and Stamina;Knowledge and understanding of Target Heart Rate Range (THRR);Able to understand and use rate of perceived exertion (RPE) scale       Comments Pt first day in the [facility] program. Pt tolerated exercise well with an average MET level of 1.6. He is off to a good start and is learning his THRR, RPE and ExRx       Expected Outcomes Will continue to monitor pt and progress workloads as tolerated without sign or symptom          Discharge Exercise Prescription (Final Exercise Prescription Changes):  Exercise Prescription Changes - 06/05/24 0834       Response to Exercise   Blood Pressure (Admit) 120/94    Blood Pressure (Exercise) 124/88    Blood Pressure (Exit) 132/86    Heart Rate (Admit) 82 bpm    Heart Rate (Exercise) 98 bpm    Heart Rate (Exit) 91 bpm    Rating of Perceived Exertion (Exercise) 9    Perceived Dyspnea (Exercise) 0    Symptoms none    Comments Pt first day in the [facility] program    Duration Progress to 30 minutes of  aerobic without signs/symptoms  of physical distress    Intensity THRR unchanged      Progression   Progression Continue to progress workloads to maintain intensity without signs/symptoms of physical distress.    Average METs 1.9      Resistance Training   Training Prescription Yes    Weight 3 lbs    Reps 10-15    Time 10 Minutes      NuStep   Level 1    SPM 56    Minutes 15    METs  1.6      Arm Ergometer   Level 2    Watts 16    RPM 45    Minutes 15    METs 1.6          Nutrition:  Target Goals: Understanding of nutrition guidelines, daily intake of sodium 1500mg , cholesterol 200mg , calories 30% from fat and 7% or less from saturated fats, daily to have 5 or more servings of fruits and vegetables.  Education: Nutrition 1 -Group instruction provided by verbal, written material, interactive activities, discussions, models, and posters to present general guidelines for heart healthy nutrition including macronutrients, label reading, and promoting whole foods over processed counterparts. Education serves as Pensions consultant of discussion of heart healthy eating for all. Written material provided at class time.    Education: Nutrition 2 -Group instruction provided by verbal, written material, interactive activities, discussions, models, and posters to present general guidelines for heart healthy nutrition including sodium, cholesterol, and saturated fat. Providing guidance of habit forming to improve blood pressure, cholesterol, and body weight. Written material provided at class time.     Biometrics:  Pre Biometrics - 05/30/24 0801       Pre Biometrics   Waist Circumference 46.5 inches    Hip Circumference 48 inches    Waist to Hip Ratio 0.97 %    Triceps Skinfold 21 mm    % Body Fat 33.5 %    Grip Strength 23 kg    Flexibility --   Not performed. 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   Single Leg Stand 30 seconds           Nutrition Therapy Plan and Nutrition Goals:   Nutrition Assessments:  Nutrition  Assessments - 06/05/24 0947       Rate Your Plate Scores   Pre Score 58         MEDIFICTS Score Key: >=70 Need to make dietary changes  40-70 Heart Healthy Diet <= 40 Therapeutic Level Cholesterol Diet  Flowsheet Row INTENSIVE CARDIAC REHAB from 06/05/2024 in [facility]  Picture Your Plate Total Score on Admission 58   Picture Your Plate Scores: <59 Unhealthy dietary pattern with much room for improvement. 41-50 Dietary pattern unlikely to meet recommendations for good health and room for improvement. 51-60 More healthful dietary pattern, with some room for improvement.  >60 Healthy dietary pattern, although there may be some specific behaviors that could be improved.    Nutrition Goals Re-Evaluation:   Nutrition Goals Discharge (Final Nutrition Goals Re-Evaluation):   Psychosocial: Target Goals: Acknowledge presence or absence of significant depression and/or stress, maximize coping skills, provide positive support system. Participant is able to verbalize types and ability to use techniques and skills needed for reducing stress and depression.   Education: Stress, Anxiety, and Depression - Group verbal and visual presentation to define topics covered.  Reviews how body is impacted by stress, anxiety, and depression.  Also discusses healthy ways to reduce stress and to treat/manage anxiety and depression. Written material provided at class time.   Education: Sleep Hygiene -Provides group verbal and written instruction about how sleep can affect your health.  Define sleep hygiene, discuss sleep cycles and impact of sleep habits. Review good sleep hygiene tips.   Initial Review & Psychosocial Screening:  Initial Psych Review & Screening - 06/05/24 0902       Initial Review   Current issues with Current Stress Concerns    Source of Stress Concerns Financial  Family Dynamics   Good Support System? Yes   pt  has good support system with his wife and 2 daughters     Barriers   Psychosocial barriers to participate in program The patient should benefit from training in stress management and relaxation.      Screening Interventions   Interventions Encouraged to exercise;To provide support and resources with identified psychosocial needs;Provide feedback about the scores to participant    Expected Outcomes Long Term Goal: Stressors or current issues are controlled or eliminated.;Long Term goal: The participant improves quality of Life and PHQ9 Scores as seen by post scores and/or verbalization of changes;Short Term goal: Identification and review with participant of any Quality of Life or Depression concerns found by scoring the questionnaire.          Quality of Life Scores:   Quality of Life - 05/30/24 0925       Quality of Life   Select Quality of Life      Quality of Life Scores   Health/Function Pre 19.67 %    Socioeconomic Pre 28.29 %    Psych/Spiritual Pre 22.86 %    Family Pre 24 %    GLOBAL Pre 22.74 %         Scores of 19 and below usually indicate a poorer quality of life in these areas.  A difference of  2-3 points is a clinically meaningful difference.  A difference of 2-3 points in the total score of the Quality of Life Index has been associated with significant improvement in overall quality of life, self-image, physical symptoms, and general health in studies assessing change in quality of life.  PHQ-9: Review Flowsheet       05/30/2024  Depression screen PHQ 2/9  Decreased Interest 0  Down, Depressed, Hopeless 0  PHQ - 2 Score 0  Altered sleeping 0  Tired, decreased energy 0  Change in appetite 0  Feeling bad or failure about yourself  0  Trouble concentrating 0  Moving slowly or fidgety/restless 0  Suicidal thoughts 0  PHQ-9 Score 0  Difficult doing work/chores Not difficult at all   Interpretation of Total Score  Total Score Depression Severity:  1-4 =  Minimal depression, 5-9 = Mild depression, 10-14 = Moderate depression, 15-19 = Moderately severe depression, 20-27 = Severe depression   Psychosocial Evaluation and Intervention:   Psychosocial Re-Evaluation:  Psychosocial Re-Evaluation     Row Name 06/05/24 0902 06/06/24 9081           Psychosocial Re-Evaluation   Current issues with Current Stress Concerns Current Stress Concerns      Comments -- Maor did not voice any additional pyschosocial stressors or concerns during exercise.      Expected Outcomes Bora will continue to manage or reduce his stress levels through participation in cardiac rehab. Thaxton will continue to manage or reduce his stress levels through participation in cardiac rehab.      Interventions Encouraged to attend Cardiac Rehabilitation for the exercise;Stress management education;Relaxation education Encouraged to attend Cardiac Rehabilitation for the exercise;Stress management education;Relaxation education      Continue Psychosocial Services  No Follow up required No Follow up required         Psychosocial Discharge (Final Psychosocial Re-Evaluation):  Psychosocial Re-Evaluation - 06/06/24 0918       Psychosocial Re-Evaluation   Current issues with Current Stress Concerns    Comments Elmo did not voice any additional pyschosocial stressors or concerns during exercise.    Expected  Outcomes Larue will continue to manage or reduce his stress levels through participation in cardiac rehab.    Interventions Encouraged to attend Cardiac Rehabilitation for the exercise;Stress management education;Relaxation education    Continue Psychosocial Services  No Follow up required          Vocational Rehabilitation: Provide vocational rehab assistance to qualifying candidates.   Vocational Rehab Evaluation & Intervention:  Vocational Rehab - 05/30/24 0848       Initial Vocational Rehab Evaluation & Intervention   Assessment shows need for Vocational  Rehabilitation No   Shavar works full time he is currently out on short term disability. Zaquan does not need vocational rehab at this time         Education: Education Goals: Education classes will be provided on a variety of topics geared toward better understanding of heart health and risk factor modification. Participant will state understanding/return demonstration of topics presented as noted by education test scores.  Learning Barriers/Preferences:  Learning Barriers/Preferences - 05/30/24 0850       Learning Barriers/Preferences   Learning Barriers Exercise Concerns;Sight    Learning Preferences Pictoral;Video          General Cardiac Education Topics:  AED/CPR: - Group verbal and written instruction with the use of models to demonstrate the basic use of the AED with the basic ABC's of resuscitation.   Test and Procedures: - Group verbal and visual presentation and models provide information about basic cardiac anatomy and function. Reviews the testing methods done to diagnose heart disease and the outcomes of the test results. Describes the treatment choices: Medical Management, Angioplasty, or Coronary Bypass Surgery for treating various heart conditions including Myocardial Infarction, Angina, Valve Disease, and Cardiac Arrhythmias. Written material provided at class time.   Medication Safety: - Group verbal and visual instruction to review commonly prescribed medications for heart and lung disease. Reviews the medication, class of the drug, and side effects. Includes the steps to properly store meds and maintain the prescription regimen. Written material provided at class time.   Intimacy: - Group verbal instruction through game format to discuss how heart and lung disease can affect sexual intimacy. Written material provided at class time.   Know Your Numbers and Heart Failure: - Group verbal and visual instruction to discuss disease risk factors for cardiac and  pulmonary disease and treatment options.  Reviews associated critical values for Overweight/Obesity, Hypertension, Cholesterol, and Diabetes.  Discusses basics of heart failure: signs/symptoms and treatments.  Introduces Heart Failure Zone chart for action plan for heart failure. Written material provided at class time.   Infection Prevention: - Provides verbal and written material to individual with discussion of infection control including proper hand washing and proper equipment cleaning during exercise session.   Falls Prevention: - Provides verbal and written material to individual with discussion of falls prevention and safety.   Other: -Provides group and verbal instruction on various topics (see comments)   Knowledge Questionnaire Score:  Knowledge Questionnaire Score - 05/30/24 0854       Knowledge Questionnaire Score   Pre Score 22/24          Core Components/Risk Factors/Patient Goals at Admission:  Personal Goals and Risk Factors at Admission - 05/30/24 0804       Core Components/Risk Factors/Patient Goals on Admission    Weight Management Yes;Obesity;Weight Loss    Intervention Weight Management/Obesity: Establish reasonable short term and long term weight goals.;Obesity: Provide education and appropriate resources to help participant work on and attain  dietary goals.    Admit Weight 254 lb 13.6 oz (115.6 kg)    Expected Outcomes Short Term: Continue to assess and modify interventions until short term weight is achieved;Long Term: Adherence to nutrition and physical activity/exercise program aimed toward attainment of established weight goal;Weight Loss: Understanding of general recommendations for a balanced deficit meal plan, which promotes 1-2 lb weight loss per week and includes a negative energy balance of [phone] kcal/d    Lipids Yes    Intervention Provide education and support for participant on nutrition & aerobic/resistive exercise along with prescribed  medications to achieve LDL 70mg , HDL >40mg .    Expected Outcomes Short Term: Participant states understanding of desired cholesterol values and is compliant with medications prescribed. Participant is following exercise prescription and nutrition guidelines.;Long Term: Cholesterol controlled with medications as prescribed, with individualized exercise RX and with personalized nutrition plan. Value goals: LDL < 70mg , HDL > 40 mg.    Stress Yes    Intervention Offer individual and/or small group education and counseling on adjustment to heart disease, stress management and health-related lifestyle change. Teach and support self-help strategies.;Refer participants experiencing significant psychosocial distress to appropriate mental health specialists for further evaluation and treatment. When possible, include family members and significant others in education/counseling sessions.    Expected Outcomes Short Term: Participant demonstrates changes in health-related behavior, relaxation and other stress management skills, ability to obtain effective social support, and compliance with psychotropic medications if prescribed.;Long Term: Emotional wellbeing is indicated by absence of clinically significant psychosocial distress or social isolation.          Education:Diabetes - Individual verbal and written instruction to review signs/symptoms of diabetes, desired ranges of glucose level fasting, after meals and with exercise. Acknowledge that pre and post exercise glucose checks will be done for 3 sessions at entry of program.   Core Components/Risk Factors/Patient Goals Review:   Goals and Risk Factor Review     Row Name 06/06/24 0920             Core Components/Risk Factors/Patient Goals Review   Personal Goals Review Weight Management/Obesity;Lipids;Stress       Review Keston is doing well with exercise at cardiac rehab. Vital signs and CBG's have been stable.       Expected Outcomes Venus  will continue to particpate in cardiac rehab for exercise, nutrition and lifestyle modifications          Core Components/Risk Factors/Patient Goals at Discharge (Final Review):   Goals and Risk Factor Review - 06/06/24 0920       Core Components/Risk Factors/Patient Goals Review   Personal Goals Review Weight Management/Obesity;Lipids;Stress    Review Terrelle is doing well with exercise at cardiac rehab. Vital signs and CBG's have been stable.    Expected Outcomes Zackary will continue to particpate in cardiac rehab for exercise, nutrition and lifestyle modifications          ITP Comments:  ITP Comments     Row Name 05/30/24 0801 06/05/24 0857         ITP Comments Medical Director- Dr. Wilbert Bihari, MD. Introduction to the [facility]/ Intensive Cardiac Rehab Program. Reviewed initial orientation folder with [facility]. 30 Day ITP review.  Oddie started cardiac rehab today (06/05/24) and tolerated exercise well.         Comments: see ITP comments

## 2024-06-14 ENCOUNTER — Encounter (HOSPITAL_COMMUNITY)
Admission: RE | Admit: 2024-06-14 | Discharge: 2024-06-14 | Disposition: A | Discharge status: Home / Self Care | Source: Ambulatory Visit | Attending: Cardiovascular Disease | Admitting: Cardiovascular Disease

## 2024-06-14 DIAGNOSIS — I213 ST elevation (STEMI) myocardial infarction of unspecified site: Secondary | ICD-10-CM

## 2024-06-16 ENCOUNTER — Encounter (HOSPITAL_COMMUNITY)
Admission: RE | Admit: 2024-06-16 | Discharge: 2024-06-16 | Disposition: A | Discharge status: Home / Self Care | Source: Ambulatory Visit | Attending: Cardiovascular Disease | Admitting: Cardiovascular Disease

## 2024-06-16 DIAGNOSIS — I213 ST elevation (STEMI) myocardial infarction of unspecified site: Secondary | ICD-10-CM

## 2024-06-19 ENCOUNTER — Encounter (HOSPITAL_COMMUNITY)
Admission: RE | Admit: 2024-06-19 | Discharge: 2024-06-19 | Disposition: A | Discharge status: Home / Self Care | Source: Ambulatory Visit | Attending: Cardiovascular Disease | Admitting: Cardiovascular Disease

## 2024-06-19 ENCOUNTER — Encounter (HOSPITAL_COMMUNITY)

## 2024-06-19 DIAGNOSIS — I213 ST elevation (STEMI) myocardial infarction of unspecified site: Secondary | ICD-10-CM

## 2024-06-21 ENCOUNTER — Encounter (HOSPITAL_COMMUNITY)
Admission: RE | Admit: 2024-06-21 | Discharge: 2024-06-21 | Disposition: A | Discharge status: Home / Self Care | Source: Ambulatory Visit | Attending: Cardiovascular Disease | Admitting: Cardiovascular Disease

## 2024-06-21 DIAGNOSIS — I213 ST elevation (STEMI) myocardial infarction of unspecified site: Secondary | ICD-10-CM | POA: Diagnosis not present

## 2024-06-23 ENCOUNTER — Encounter (HOSPITAL_COMMUNITY)
Admission: RE | Admit: 2024-06-23 | Discharge: 2024-06-23 | Disposition: A | Discharge status: Home / Self Care | Source: Ambulatory Visit | Attending: Cardiovascular Disease | Admitting: Cardiovascular Disease

## 2024-06-23 DIAGNOSIS — I213 ST elevation (STEMI) myocardial infarction of unspecified site: Secondary | ICD-10-CM

## 2024-06-26 ENCOUNTER — Encounter (HOSPITAL_COMMUNITY)
Admission: RE | Admit: 2024-06-26 | Discharge: 2024-06-26 | Disposition: A | Discharge status: Home / Self Care | Source: Ambulatory Visit | Attending: Cardiovascular Disease | Admitting: Cardiovascular Disease

## 2024-06-26 DIAGNOSIS — I213 ST elevation (STEMI) myocardial infarction of unspecified site: Secondary | ICD-10-CM | POA: Diagnosis not present

## 2024-06-28 ENCOUNTER — Encounter (HOSPITAL_COMMUNITY)
Admission: RE | Admit: 2024-06-28 | Discharge: 2024-06-28 | Disposition: A | Discharge status: Home / Self Care | Source: Ambulatory Visit | Attending: Cardiovascular Disease | Admitting: Cardiovascular Disease

## 2024-06-28 DIAGNOSIS — I213 ST elevation (STEMI) myocardial infarction of unspecified site: Secondary | ICD-10-CM | POA: Diagnosis not present

## 2024-06-30 ENCOUNTER — Encounter (HOSPITAL_COMMUNITY)
Admission: RE | Admit: 2024-06-30 | Discharge: 2024-06-30 | Disposition: A | Discharge status: Home / Self Care | Source: Ambulatory Visit | Attending: Cardiovascular Disease | Admitting: Cardiovascular Disease

## 2024-06-30 DIAGNOSIS — I213 ST elevation (STEMI) myocardial infarction of unspecified site: Secondary | ICD-10-CM

## 2024-07-03 ENCOUNTER — Encounter (HOSPITAL_COMMUNITY)
Admission: RE | Admit: 2024-07-03 | Discharge: 2024-07-03 | Disposition: A | Discharge status: Home / Self Care | Source: Ambulatory Visit | Attending: Cardiovascular Disease | Admitting: Cardiovascular Disease

## 2024-07-03 DIAGNOSIS — I213 ST elevation (STEMI) myocardial infarction of unspecified site: Secondary | ICD-10-CM

## 2024-07-05 ENCOUNTER — Encounter (HOSPITAL_COMMUNITY): Admission: RE | Admit: 2024-07-05 | Source: Ambulatory Visit

## 2024-07-07 ENCOUNTER — Encounter (HOSPITAL_COMMUNITY)
Admission: RE | Admit: 2024-07-07 | Discharge: 2024-07-07 | Disposition: A | Discharge status: Home / Self Care | Source: Ambulatory Visit | Attending: Cardiovascular Disease | Admitting: Cardiovascular Disease

## 2024-07-07 DIAGNOSIS — I213 ST elevation (STEMI) myocardial infarction of unspecified site: Secondary | ICD-10-CM | POA: Diagnosis not present

## 2024-07-10 ENCOUNTER — Encounter (HOSPITAL_COMMUNITY): Admission: RE | Admit: 2024-07-10 | Source: Ambulatory Visit

## 2024-07-10 NOTE — Progress Notes (Signed)
 Cardiac Individual Treatment Plan  Patient Details  Name: Keith Case MRN: [id number] Date of Birth: 04-Aug-1976 Referring Provider:   Flowsheet Row INTENSIVE CARDIAC REHAB ORIENT from 05/30/2024 in [facility]  Referring Provider Verlin Lonni BIRCH, MD    Initial Encounter Date:  Flowsheet Row INTENSIVE CARDIAC REHAB ORIENT from 05/30/2024 in [facility]  Date 05/30/24    Visit Diagnosis: 04/24/24 STEMI  Patient's Home Medications on Admission:  Current Outpatient Medications:    acetaminophen  (TYLENOL ) 500 MG tablet, Take 1,000 mg by mouth every 8 (eight) hours as needed for mild pain (pain score 1-3) or moderate pain (pain score 4-6)., Disp: , Rfl:    apixaban  (ELIQUIS ) 5 MG TABS tablet, Take 5 mg by mouth 2 (two) times daily., Disp: , Rfl:    aspirin  81 MG chewable tablet, Chew 1 tablet (81 mg total) by mouth daily., Disp: 30 tablet, Rfl: 0   atorvastatin  (LIPITOR) 80 MG tablet, Take 1 tablet (80 mg total) by mouth daily., Disp: 90 tablet, Rfl: 2   clopidogrel  (PLAVIX ) 75 MG tablet, Take 1 tablet (75 mg total) by mouth daily., Disp: 90 tablet, Rfl: 2   nitroGLYCERIN  (NITROSTAT ) 0.4 MG SL tablet, Place 1 tablet (0.4 mg total) under the tongue every 5 (five) minutes as needed., Disp: 25 tablet, Rfl: 2  Past Medical History: Past Medical History:  Diagnosis Date   Chicken pox    Coronary artery disease    DVT (deep venous thrombosis) (HCC)    Hyperlipidemia     Tobacco Use: Social History   Tobacco Use  Smoking Status Never  Smokeless Tobacco Not on file    Labs: Review Flowsheet       Latest Ref Rng & Units 02/11/2011 03/27/2011 04/24/2024  Labs for ITP Cardiac and Pulmonary Rehab  Cholestrol 0 - 200 mg/dL - 833  849   LDL (calc) 0 - 99 mg/dL - 897  895   HDL-C >59 mg/dL - 30  34   Trlycerides <150 mg/dL - 827  58   Hemoglobin A1c 4.8 - 5.6  % - - 5.2   TCO2 22 - 32 mmol/L 24  - 21     Capillary Blood Glucose: No results found for: GLUCAP   Exercise Target Goals: Exercise Program Goal: Individual exercise prescription set using results from initial 6 min walk test and THRR while considering  patient's activity barriers and safety.   Exercise Prescription Goal: Initial exercise prescription builds to 30-45 minutes a day of aerobic activity, 2-3 days per week.  Home exercise guidelines will be given to patient during program as part of exercise prescription that the participant will acknowledge.  Activity Barriers & Risk Stratification:  Activity Barriers & Cardiac Risk Stratification - 05/30/24 1025       Activity Barriers & Cardiac Risk Stratification   Activity Barriers Balance Concerns;Assistive Device;Left Knee Replacement    Cardiac Risk Stratification High          6 Minute Walk:  6 Minute Walk     Row Name 05/30/24 0939         6 Minute Walk   Phase Initial     Distance 746 feet     Walk Time 6 minutes     # of Rest Breaks 0     MPH 1.41     METS 2.67     RPE 11  Perceived Dyspnea  0     VO2 Peak 9.35     Symptoms No     Resting HR 94 bpm     Resting BP 118/82     Resting Oxygen Saturation  95 %     Exercise Oxygen Saturation  during 6 min walk 97 %     Max Ex. HR 95 bpm     Max Ex. BP 130/90     2 Minute Post BP 118/94        Oxygen Initial Assessment:   Oxygen Re-Evaluation:   Oxygen Discharge (Final Oxygen Re-Evaluation):   Initial Exercise Prescription:  Initial Exercise Prescription - 05/30/24 1000       Date of Initial Exercise RX and Referring Provider   Date 05/30/24    Referring Provider Verlin Lonni BIRCH, MD    Expected Discharge Date 08/23/24      NuStep   Level 1    SPM 85    Minutes 15    METs 2.5      Arm Ergometer   Level 2    Watts 15    RPM 25    Minutes 15    METs 2      Prescription Details   Frequency (times per week) 3     Duration Progress to 30 minutes of continuous aerobic without signs/symptoms of physical distress      Intensity   THRR 40-80% of Max Heartrate 69-138    Ratings of Perceived Exertion 11-13    Perceived Dyspnea 0-4      Progression   Progression Continue to progress workloads to maintain intensity without signs/symptoms of physical distress.      Resistance Training   Training Prescription Yes    Weight 3 lbs    Reps 10-15          Perform Capillary Blood Glucose checks as needed.  Exercise Prescription Changes:   Exercise Prescription Changes     Row Name 06/05/24 [phone] 06/30/24 0841           Response to Exercise   Blood Pressure (Admit) 120/94 114/78      Blood Pressure (Exercise) 124/88 --      Blood Pressure (Exit) 132/86 110/66      Heart Rate (Admit) 82 bpm 69 bpm      Heart Rate (Exercise) 98 bpm 95 bpm      Heart Rate (Exit) 91 bpm 67 bpm      Rating of Perceived Exertion (Exercise) 9 10      Perceived Dyspnea (Exercise) 0 0      Symptoms none none      Comments Pt first day in the Pritikin ICR program Reviewed MET's, goals and home ExRx      Duration Progress to 30 minutes of  aerobic without signs/symptoms of physical distress Progress to 30 minutes of  aerobic without signs/symptoms of physical distress      Intensity THRR unchanged THRR unchanged        Progression   Progression Continue to progress workloads to maintain intensity without signs/symptoms of physical distress. Continue to progress workloads to maintain intensity without signs/symptoms of physical distress.      Average METs 1.9 1.85        Resistance Training   Training Prescription Yes Yes      Weight 3 lbs 3 lbs      Reps 10-15 10-15      Time 10 Minutes 10 Minutes  NuStep   Level 1 3      SPM 56 81      Minutes 15 15      METs 1.6 1.9        Arm Ergometer   Level 2 2.5      Watts 16 18      RPM 45 47      Minutes 15 15      METs 1.6 1.8        Home Exercise Plan    Plans to continue exercise at -- Home (comment)      Frequency -- Add 2 additional days to program exercise sessions.      Initial Home Exercises Provided -- 06/30/24         Exercise Comments:   Exercise Comments     Row Name 06/05/24 0839 06/30/24 0848         Exercise Comments Pt first day in the Pritikin ICR program. Pt tolerated exercise well with an average MET level of 1.6. He is off to a good start and is learning his THRR, RPE and ExRx Reviewed MET's, goals and home ExRx. Pt tolerated exercise well with an average MET level of 1.85. He is doing well and progressing MET's and WL's. Pt feel's good about his goals and is increasing endurance, he says he's able to do more around the house and his PT is going very well. He is going to check with his PT for guidance on joining a gym, but I have a few ideas if they clear him. Right now he is walking about 30 mins 2 days a week and PT 2-3 days.         Exercise Goals and Review:   Exercise Goals     Row Name 05/30/24 1025             Exercise Goals   Increase Physical Activity Yes       Intervention Provide advice, education, support and counseling about physical activity/exercise needs.;Develop an individualized exercise prescription for aerobic and resistive training based on initial evaluation findings, risk stratification, comorbidities and participant's personal goals.       Expected Outcomes Short Term: Attend rehab on a regular basis to increase amount of physical activity.;Long Term: Exercising regularly at least 3-5 days a week.;Long Term: Add in home exercise to make exercise part of routine and to increase amount of physical activity.       Increase Strength and Stamina Yes       Intervention Provide advice, education, support and counseling about physical activity/exercise needs.;Develop an individualized exercise prescription for aerobic and resistive training based on initial evaluation findings, risk stratification,  comorbidities and participant's personal goals.       Expected Outcomes Short Term: Increase workloads from initial exercise prescription for resistance, speed, and METs.;Short Term: Perform resistance training exercises routinely during rehab and add in resistance training at home;Long Term: Improve cardiorespiratory fitness, muscular endurance and strength as measured by increased METs and functional capacity ( )       Able to understand and use rate of perceived exertion (RPE) scale Yes       Intervention Provide education and explanation on how to use RPE scale       Expected Outcomes Short Term: Able to use RPE daily in rehab to express subjective intensity level;Long Term:  Able to use RPE to guide intensity level when exercising independently       Knowledge and understanding of Target Heart  Rate Range (THRR) Yes       Intervention Provide education and explanation of THRR including how the numbers were predicted and where they are located for reference       Expected Outcomes Short Term: Able to state/look up THRR;Long Term: Able to use THRR to govern intensity when exercising independently;Short Term: Able to use daily as guideline for intensity in rehab       Able to check pulse independently Yes       Intervention Provide education and demonstration on how to check pulse in carotid and radial arteries.;Review the importance of being able to check your own pulse for safety during independent exercise       Expected Outcomes Short Term: Able to explain why pulse checking is important during independent exercise;Long Term: Able to check pulse independently and accurately       Understanding of Exercise Prescription Yes       Intervention Provide education, explanation, and written materials on patient's individual exercise prescription       Expected Outcomes Short Term: Able to explain program exercise prescription;Long Term: Able to explain home exercise prescription to exercise  independently          Exercise Goals Re-Evaluation :  Exercise Goals Re-Evaluation     Row Name 06/05/24 0837 06/30/24 0844           Exercise Goal Re-Evaluation   Exercise Goals Review Increase Physical Activity;Understanding of Exercise Prescription;Increase Strength and Stamina;Knowledge and understanding of Target Heart Rate Range (THRR);Able to understand and use rate of perceived exertion (RPE) scale Increase Physical Activity;Understanding of Exercise Prescription;Increase Strength and Stamina;Knowledge and understanding of Target Heart Rate Range (THRR);Able to understand and use rate of perceived exertion (RPE) scale      Comments Pt first day in the Pritikin ICR program. Pt tolerated exercise well with an average MET level of 1.6. He is off to a good start and is learning his THRR, RPE and ExRx Reviewed MET's, goals and home ExRx. Pt tolerated exercise well with an average MET level of 1.85. He is doing well and progressing MET's and WL's. Pt feel's good about his goals and is increasing endurance, he says he's able to do more around the house and his PT is going very well. He is going to check with his PT for guidance on joining a gym, but I have a few ideas if they clear him. Right now he is walking about 30 mins 2 days a week and PT 2-3 days.      Expected Outcomes Will continue to monitor pt and progress workloads as tolerated without sign or symptom Will continue to monitor pt and progress workloads as tolerated without sign or symptom         Discharge Exercise Prescription (Final Exercise Prescription Changes):  Exercise Prescription Changes - 06/30/24 0841       Response to Exercise   Blood Pressure (Admit) 114/78    Blood Pressure (Exit) 110/66    Heart Rate (Admit) 69 bpm    Heart Rate (Exercise) 95 bpm    Heart Rate (Exit) 67 bpm    Rating of Perceived Exertion (Exercise) 10    Perceived Dyspnea (Exercise) 0    Symptoms none    Comments Reviewed MET's, goals and  home ExRx    Duration Progress to 30 minutes of  aerobic without signs/symptoms of physical distress    Intensity THRR unchanged      Progression   Progression Continue to  progress workloads to maintain intensity without signs/symptoms of physical distress.    Average METs 1.85      Resistance Training   Training Prescription Yes    Weight 3 lbs    Reps 10-15    Time 10 Minutes      NuStep   Level 3    SPM 81    Minutes 15    METs 1.9      Arm Ergometer   Level 2.5    Watts 18    RPM 47    Minutes 15    METs 1.8      Home Exercise Plan   Plans to continue exercise at Home (comment)    Frequency Add 2 additional days to program exercise sessions.    Initial Home Exercises Provided 06/30/24          Nutrition:  Target Goals: Understanding of nutrition guidelines, daily intake of sodium 1500mg , cholesterol 200mg , calories 30% from fat and 7% or less from saturated fats, daily to have 5 or more servings of fruits and vegetables.  Biometrics:  Pre Biometrics - 05/30/24 0801       Pre Biometrics   Waist Circumference 46.5 inches    Hip Circumference 48 inches    Waist to Hip Ratio 0.97 %    Triceps Skinfold 21 mm    % Body Fat 33.5 %    Grip Strength 23 kg    Flexibility --   Not performed. 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   Single Leg Stand 30 seconds           Nutrition Therapy Plan and Nutrition Goals:   Nutrition Assessments:  Nutrition Assessments - 06/05/24 0947       Rate Your Plate Scores   Pre Score 58         MEDIFICTS Score Key: >=70 Need to make dietary changes  40-70 Heart Healthy Diet <= 40 Therapeutic Level Cholesterol Diet   Flowsheet Row INTENSIVE CARDIAC REHAB from 06/05/2024 in [facility]  Picture Your Plate Total Score on Admission 58   Picture Your Plate Scores: <59 Unhealthy dietary pattern with much room for improvement. 41-50 Dietary pattern unlikely to meet  recommendations for good health and room for improvement. 51-60 More healthful dietary pattern, with some room for improvement.  >60 Healthy dietary pattern, although there may be some specific behaviors that could be improved.    Nutrition Goals Re-Evaluation:   Nutrition Goals Re-Evaluation:   Nutrition Goals Discharge (Final Nutrition Goals Re-Evaluation):   Psychosocial: Target Goals: Acknowledge presence or absence of significant depression and/or stress, maximize coping skills, provide positive support system. Participant is able to verbalize types and ability to use techniques and skills needed for reducing stress and depression.  Initial Review & Psychosocial Screening:  Initial Psych Review & Screening - 06/05/24 0902       Initial Review   Current issues with Current Stress Concerns    Source of Stress Concerns Financial      Family Dynamics   Good Support System? Yes   pt has good support system with his wife and 2 daughters     Barriers   Psychosocial barriers to participate in program The patient should benefit from training in stress management and relaxation.      Screening Interventions   Interventions Encouraged to exercise;To provide support and resources with identified psychosocial needs;Provide feedback about the scores to participant    Expected Outcomes  Long Term Goal: Stressors or current issues are controlled or eliminated.;Long Term goal: The participant improves quality of Life and PHQ9 Scores as seen by post scores and/or verbalization of changes;Short Term goal: Identification and review with participant of any Quality of Life or Depression concerns found by scoring the questionnaire.          Quality of Life Scores:  Quality of Life - 05/30/24 0925       Quality of Life   Select Quality of Life      Quality of Life Scores   Health/Function Pre 19.67 %    Socioeconomic Pre 28.29 %    Psych/Spiritual Pre 22.86 %    Family Pre 24 %     GLOBAL Pre 22.74 %         Scores of 19 and below usually indicate a poorer quality of life in these areas.  A difference of  2-3 points is a clinically meaningful difference.  A difference of 2-3 points in the total score of the Quality of Life Index has been associated with significant improvement in overall quality of life, self-image, physical symptoms, and general health in studies assessing change in quality of life.  PHQ-9: Review Flowsheet       05/30/2024  Depression screen PHQ 2/9  Decreased Interest 0  Down, Depressed, Hopeless 0  PHQ - 2 Score 0  Altered sleeping 0  Tired, decreased energy 0  Change in appetite 0  Feeling bad or failure about yourself  0  Trouble concentrating 0  Moving slowly or fidgety/restless 0  Suicidal thoughts 0  PHQ-9 Score 0  Difficult doing work/chores Not difficult at all   Interpretation of Total Score  Total Score Depression Severity:  1-4 = Minimal depression, 5-9 = Mild depression, 10-14 = Moderate depression, 15-19 = Moderately severe depression, 20-27 = Severe depression   Psychosocial Evaluation and Intervention:   Psychosocial Re-Evaluation:  Psychosocial Re-Evaluation     Row Name 06/05/24 0902 06/06/24 0918 07/05/24 0826         Psychosocial Re-Evaluation   Current issues with Current Stress Concerns Current Stress Concerns Current Stress Concerns     Comments -- Justo did not voice any additional pyschosocial stressors or concerns during exercise. Bannon has not voiced any additional pyschosocial stressors or concerns during exercise.     Expected Outcomes Donyale will continue to manage or reduce his stress levels through participation in cardiac rehab. Jamear will continue to manage or reduce his stress levels through participation in cardiac rehab. Yaviel will continue to manage or reduce his stress levels through participation in cardiac rehab.     Interventions Encouraged to attend Cardiac Rehabilitation for the  exercise;Stress management education;Relaxation education Encouraged to attend Cardiac Rehabilitation for the exercise;Stress management education;Relaxation education Encouraged to attend Cardiac Rehabilitation for the exercise;Stress management education;Relaxation education     Continue Psychosocial Services  No Follow up required No Follow up required No Follow up required        Psychosocial Discharge (Final Psychosocial Re-Evaluation):  Psychosocial Re-Evaluation - 07/05/24 0826       Psychosocial Re-Evaluation   Current issues with Current Stress Concerns    Comments Blase has not voiced any additional pyschosocial stressors or concerns during exercise.    Expected Outcomes Gabreil will continue to manage or reduce his stress levels through participation in cardiac rehab.    Interventions Encouraged to attend Cardiac Rehabilitation for the exercise;Stress management education;Relaxation education    Continue Psychosocial Services  No Follow up required          Vocational Rehabilitation: Provide vocational rehab assistance to qualifying candidates.   Vocational Rehab Evaluation & Intervention:  Vocational Rehab - 05/30/24 0848       Initial Vocational Rehab Evaluation & Intervention   Assessment shows need for Vocational Rehabilitation No   Lacharles works full time he is currently out on short term disability. Pius does not need vocational rehab at this time         Education: Education Goals: Education classes will be provided on a weekly basis, covering required topics. Participant will state understanding/return demonstration of topics presented.    Education     Row Name 06/14/24 0900     Education   Cardiac Education Topics Pritikin   Secondary School Teacher School   Educator Nurse   Weekly Topic [employer] Plant-Based Proteins   Instruction Review Code 1- Verbalizes Understanding   Class Start Time 0815   Class Stop Time 0848   Class Time  Calculation (min) 33 min    Row Name 06/16/24 0900     Education   Cardiac Education Topics Pritikin   Select Core Videos     Core Videos   Educator Dietitian   Select Nutrition   Nutrition Facts on Fat   Instruction Review Code 1- Verbalizes Understanding   Class Start Time [phone]   Class Stop Time 0853   Class Time Calculation (min) 37 min    Row Name 06/23/24 0800     Education   Cardiac Education Topics Pritikin   Select Workshops     Workshops   Educator Exercise Physiologist   Select Exercise   Exercise Workshop Managing Heart Disease: Your Path to a Healthier Heart   Instruction Review Code 1- Verbalizes Understanding   Class Start Time [phone]   Class Stop Time 0900   Class Time Calculation (min) 41 min    Row Name 06/26/24 0700     Education   Cardiac Education Topics Pritikin   Select Core Videos     Core Videos   Educator Exercise Physiologist   Select Psychosocial   Psychosocial Healthy Minds, Bodies, Hearts   Instruction Review Code 1- Verbalizes Understanding   Class Start Time 0815   Class Stop Time 0847   Class Time Calculation (min) 32 min    Row Name 06/30/24 0800     Education   Cardiac Education Topics Pritikin   [employer]     Workshops   Educator Dietitian   Select Nutrition   Nutrition Workshop Label Reading   Instruction Review Code 1- Verbalizes Understanding   Class Start Time 0815   Class Stop Time [phone]   Class Time Calculation (min) 37 min    Row Name 07/03/24 0900     Education   Cardiac Education Topics Pritikin   Select Workshops     Workshops   Educator Exercise Physiologist   Select Exercise   Exercise Workshop Location Manager and Fall Prevention   Instruction Review Code 1- Verbalizes Understanding   Class Start Time 0815   Class Stop Time 0850   Class Time Calculation (min) 35 min    Row Name 07/07/24 0800     Education   Cardiac Education Topics Pritikin   Select Core Videos     Core Videos    Educator Dietitian   Select Nutrition   Nutrition Other  label reading   Instruction Review Code 1- Verbalizes Understanding  Class Start Time 0815   Class Stop Time 0847   Class Time Calculation (min) 32 min      Core Videos: Exercise    Move It!  Clinical staff conducted group or individual video education with verbal and written material and guidebook.  Patient learns the recommended Pritikin exercise program. Exercise with the goal of living a long, healthy life. Some of the health benefits of exercise include controlled diabetes, healthier blood pressure levels, improved cholesterol levels, improved heart and lung capacity, improved sleep, and better body composition. Everyone should speak with their doctor before starting or changing an exercise routine.  Biomechanical Limitations Clinical staff conducted group or individual video education with verbal and written material and guidebook.  Patient learns how biomechanical limitations can impact exercise and how we can mitigate and possibly overcome limitations to have an impactful and balanced exercise routine.  Body Composition Clinical staff conducted group or individual video education with verbal and written material and guidebook.  Patient learns that body composition (ratio of muscle mass to fat mass) is a key component to assessing overall fitness, rather than body weight alone. Increased fat mass, especially visceral belly fat, can put us  at increased risk for metabolic syndrome, type 2 diabetes, heart disease, and even death. It is recommended to combine diet and exercise (cardiovascular and resistance training) to improve your body composition. Seek guidance from your physician and exercise physiologist before implementing an exercise routine.  Exercise Action Plan Clinical staff conducted group or individual video education with verbal and written material and guidebook.  Patient learns the recommended strategies to achieve  and enjoy long-term exercise adherence, including variety, self-motivation, self-efficacy, and positive decision making. Benefits of exercise include fitness, good health, weight management, more energy, better sleep, less stress, and overall well-being.  Medical   Heart Disease Risk Reduction Clinical staff conducted group or individual video education with verbal and written material and guidebook.  Patient learns our heart is our most vital organ as it circulates oxygen, nutrients, white blood cells, and hormones throughout the entire body, and carries waste away. Data supports a plant-based eating plan like the Pritikin Program for its effectiveness in slowing progression of and reversing heart disease. The video provides a number of recommendations to address heart disease.   Metabolic Syndrome and Belly Fat  Clinical staff conducted group or individual video education with verbal and written material and guidebook.  Patient learns what metabolic syndrome is, how it leads to heart disease, and how one can reverse it and keep it from coming back. You have metabolic syndrome if you have 3 of the following 5 criteria: abdominal obesity, high blood pressure, high triglycerides, low HDL cholesterol, and high blood sugar.  Hypertension and Heart Disease Clinical staff conducted group or individual video education with verbal and written material and guidebook.  Patient learns that high blood pressure, or hypertension, is very common in the [place] . Hypertension is largely due to excessive salt intake, but other important risk factors include being overweight, physical inactivity, drinking too much alcohol, smoking, and not eating enough potassium from fruits and vegetables. High blood pressure is a leading risk factor for heart attack, stroke, congestive heart failure, dementia, kidney failure, and premature death. Long-term effects of excessive salt intake include stiffening of the arteries and  thickening of heart muscle and organ damage. Recommendations include ways to reduce hypertension and the risk of heart disease.  Diseases of Our Time - Focusing on Diabetes Clinical staff conducted group or  individual video education with verbal and written material and guidebook.  Patient learns why the best way to stop diseases of our time is prevention, through food and other lifestyle changes. Medicine (such as prescription pills and surgeries) is often only a Band-Aid on the problem, not a long-term solution. Most common diseases of our time include obesity, type 2 diabetes, hypertension, heart disease, and cancer. The Pritikin Program is recommended and has been proven to help reduce, reverse, and/or prevent the damaging effects of metabolic syndrome.  Nutrition   Overview of the Pritikin Eating Plan  Clinical staff conducted group or individual video education with verbal and written material and guidebook.  Patient learns about the Pritikin Eating Plan for disease risk reduction. The Pritikin Eating Plan emphasizes a wide variety of unrefined, minimally-processed carbohydrates, like fruits, vegetables, whole grains, and legumes. Go, Caution, and Stop food choices are explained. Plant-based and lean animal proteins are emphasized. Rationale provided for low sodium intake for blood pressure control, low added sugars for blood sugar stabilization, and low added fats and oils for coronary artery disease risk reduction and weight management.  Calorie Density  Clinical staff conducted group or individual video education with verbal and written material and guidebook.  Patient learns about calorie density and how it impacts the Pritikin Eating Plan. Knowing the characteristics of the food you choose will help you decide whether those foods will lead to weight gain or weight loss, and whether you want to consume more or less of them. Weight loss is usually a side effect of the Pritikin Eating Plan  because of its focus on low calorie-dense foods.  Label Reading  Clinical staff conducted group or individual video education with verbal and written material and guidebook.  Patient learns about the Pritikin recommended label reading guidelines and corresponding recommendations regarding calorie density, added sugars, sodium content, and whole grains.  Dining Out - Part 1  Clinical staff conducted group or individual video education with verbal and written material and guidebook.  Patient learns that restaurant meals can be sabotaging because they can be so high in calories, fat, sodium, and/or sugar. Patient learns recommended strategies on how to positively address this and avoid unhealthy pitfalls.  Facts on Fats  Clinical staff conducted group or individual video education with verbal and written material and guidebook.  Patient learns that lifestyle modifications can be just as effective, if not more so, as many medications for lowering your risk of heart disease. A Pritikin lifestyle can help to reduce your risk of inflammation and atherosclerosis (cholesterol build-up, or plaque, in the artery walls). Lifestyle interventions such as dietary choices and physical activity address the cause of atherosclerosis. A review of the types of fats and their impact on blood cholesterol levels, along with dietary recommendations to reduce fat intake is also included.  Nutrition Action Plan  Clinical staff conducted group or individual video education with verbal and written material and guidebook.  Patient learns how to incorporate Pritikin recommendations into their lifestyle. Recommendations include planning and keeping personal health goals in mind as an important part of their success.  Healthy Mind-Set    Healthy Minds, Bodies, Hearts  Clinical staff conducted group or individual video education with verbal and written material and guidebook.  Patient learns how to identify when they are  stressed. Video will discuss the impact of that stress, as well as the many benefits of stress management. Patient will also be introduced to stress management techniques. The way we think, act, and  feel has an impact on our hearts.  How Our Thoughts Can Heal Our Hearts  Clinical staff conducted group or individual video education with verbal and written material and guidebook.  Patient learns that negative thoughts can cause depression and anxiety. This can result in negative lifestyle behavior and serious health problems. Cognitive behavioral therapy is an effective method to help control our thoughts in order to change and improve our emotional outlook.  Additional Videos:  Exercise    Improving Performance  Clinical staff conducted group or individual video education with verbal and written material and guidebook.  Patient learns to use a non-linear approach by alternating intensity levels and lengths of time spent exercising to help burn more calories and lose more body fat. Cardiovascular exercise helps improve heart health, metabolism, hormonal balance, blood sugar control, and recovery from fatigue. Resistance training improves strength, endurance, balance, coordination, reaction time, metabolism, and muscle mass. Flexibility exercise improves circulation, posture, and balance. Seek guidance from your physician and exercise physiologist before implementing an exercise routine and learn your capabilities and proper form for all exercise.  Introduction to Yoga  Clinical staff conducted group or individual video education with verbal and written material and guidebook.  Patient learns about yoga, a discipline of the coming together of mind, breath, and body. The benefits of yoga include improved flexibility, improved range of motion, better posture and core strength, increased lung function, weight loss, and positive self-image. Yoga's heart health benefits include lowered blood pressure,  healthier heart rate, decreased cholesterol and triglyceride levels, improved immune function, and reduced stress. Seek guidance from your physician and exercise physiologist before implementing an exercise routine and learn your capabilities and proper form for all exercise.  Medical   Aging: Enhancing Your Quality of Life  Clinical staff conducted group or individual video education with verbal and written material and guidebook.  Patient learns key strategies and recommendations to stay in good physical health and enhance quality of life, such as prevention strategies, having an advocate, securing a Health Care Proxy and Power of Attorney, and keeping a list of medications and system for tracking them. It also discusses how to avoid risk for bone loss.  Biology of Weight Control  Clinical staff conducted group or individual video education with verbal and written material and guidebook.  Patient learns that weight gain occurs because we consume more calories than we burn (eating more, moving less). Even if your body weight is normal, you may have higher ratios of fat compared to muscle mass. Too much body fat puts you at increased risk for cardiovascular disease, heart attack, stroke, type 2 diabetes, and obesity-related cancers. In addition to exercise, following the Pritikin Eating Plan can help reduce your risk.  Decoding Lab Results  Clinical staff conducted group or individual video education with verbal and written material and guidebook.  Patient learns that lab test reflects one measurement whose values change over time and are influenced by many factors, including medication, stress, sleep, exercise, food, hydration, pre-existing medical conditions, and more. It is recommended to use the knowledge from this video to become more involved with your lab results and evaluate your numbers to speak with your doctor.   Diseases of Our Time - Overview  Clinical staff conducted group or  individual video education with verbal and written material and guidebook.  Patient learns that according to the CDC, 50% to 70% of chronic diseases (such as obesity, type 2 diabetes, elevated lipids, hypertension, and heart disease) are avoidable through  lifestyle improvements including healthier food choices, listening to satiety cues, and increased physical activity.  Sleep Disorders Clinical staff conducted group or individual video education with verbal and written material and guidebook.  Patient learns how good quality and duration of sleep are important to overall health and well-being. Patient also learns about sleep disorders and how they impact health along with recommendations to address them, including discussing with a physician.  Nutrition  Dining Out - Part 2 Clinical staff conducted group or individual video education with verbal and written material and guidebook.  Patient learns how to plan ahead and communicate in order to maximize their dining experience in a healthy and nutritious manner. Included are recommended food choices based on the type of restaurant the patient is visiting.   Fueling a Banker conducted group or individual video education with verbal and written material and guidebook.  There is a strong connection between our food choices and our health. Diseases like obesity and type 2 diabetes are very prevalent and are in large-part due to lifestyle choices. The Pritikin Eating Plan provides plenty of food and hunger-curbing satisfaction. It is easy to follow, affordable, and helps reduce health risks.  Menu Workshop  Clinical staff conducted group or individual video education with verbal and written material and guidebook.  Patient learns that restaurant meals can sabotage health goals because they are often packed with calories, fat, sodium, and sugar. Recommendations include strategies to plan ahead and to communicate with the manager,  chef, or server to help order a healthier meal.  Planning Your Eating Strategy  Clinical staff conducted group or individual video education with verbal and written material and guidebook.  Patient learns about the Pritikin Eating Plan and its benefit of reducing the risk of disease. The Pritikin Eating Plan does not focus on calories. Instead, it emphasizes high-quality, nutrient-rich foods. By knowing the characteristics of the foods, we choose, we can determine their calorie density and make informed decisions.  Targeting Your Nutrition Priorities  Clinical staff conducted group or individual video education with verbal and written material and guidebook.  Patient learns that lifestyle habits have a tremendous impact on disease risk and progression. This video provides eating and physical activity recommendations based on your personal health goals, such as reducing LDL cholesterol, losing weight, preventing or controlling type 2 diabetes, and reducing high blood pressure.  Vitamins and Minerals  Clinical staff conducted group or individual video education with verbal and written material and guidebook.  Patient learns different ways to obtain key vitamins and minerals, including through a recommended healthy diet. It is important to discuss all supplements you take with your doctor.   Healthy Mind-Set    Smoking Cessation  Clinical staff conducted group or individual video education with verbal and written material and guidebook.  Patient learns that cigarette smoking and tobacco addiction pose a serious health risk which affects millions of people. Stopping smoking will significantly reduce the risk of heart disease, lung disease, and many forms of cancer. Recommended strategies for quitting are covered, including working with your doctor to develop a successful plan.  Culinary   Becoming a Set Designer conducted group or individual video education with verbal and written  material and guidebook.  Patient learns that cooking at home can be healthy, cost-effective, quick, and puts them in control. Keys to cooking healthy recipes will include looking at your recipe, assessing your equipment needs, planning ahead, making it simple, choosing cost-effective seasonal ingredients,  and limiting the use of added fats, salts, and sugars.  Cooking - Breakfast and Snacks  Clinical staff conducted group or individual video education with verbal and written material and guidebook.  Patient learns how important breakfast is to satiety and nutrition through the entire day. Recommendations include key foods to eat during breakfast to help stabilize blood sugar levels and to prevent overeating at meals later in the day. Planning ahead is also a key component.  Cooking - Educational Psychologist conducted group or individual video education with verbal and written material and guidebook.  Patient learns eating strategies to improve overall health, including an approach to cook more at home. Recommendations include thinking of animal protein as a side on your plate rather than center stage and focusing instead on lower calorie dense options like vegetables, fruits, whole grains, and plant-based proteins, such as beans. Making sauces in large quantities to freeze for later and leaving the skin on your vegetables are also recommended to maximize your experience.  Cooking - Healthy Salads and Dressing Clinical staff conducted group or individual video education with verbal and written material and guidebook.  Patient learns that vegetables, fruits, whole grains, and legumes are the foundations of the Pritikin Eating Plan. Recommendations include how to incorporate each of these in flavorful and healthy salads, and how to create homemade salad dressings. Proper handling of ingredients is also covered. Cooking - Soups and [employer] - Soups and Desserts Clinical staff conducted  group or individual video education with verbal and written material and guidebook.  Patient learns that Pritikin soups and desserts make for easy, nutritious, and delicious snacks and meal components that are low in sodium, fat, sugar, and calorie density, while high in vitamins, minerals, and filling fiber. Recommendations include simple and healthy ideas for soups and desserts.   Overview     The Pritikin Solution Program Overview Clinical staff conducted group or individual video education with verbal and written material and guidebook.  Patient learns that the results of the Pritikin Program have been documented in more than 100 articles published in peer-reviewed journals, and the benefits include reducing risk factors for (and, in some cases, even reversing) high cholesterol, high blood pressure, type 2 diabetes, obesity, and more! An overview of the three key pillars of the Pritikin Program will be covered: eating well, doing regular exercise, and having a healthy mind-set.  WORKSHOPS  Exercise: Exercise Basics: Building Your Action Plan Clinical staff led group instruction and group discussion with PowerPoint presentation and patient guidebook. To enhance the learning environment the use of posters, models and videos may be added. At the conclusion of this workshop, patients will comprehend the difference between physical activity and exercise, as well as the benefits of incorporating both, into their routine. Patients will understand the FITT (Frequency, Intensity, Time, and Type) principle and how to use it to build an exercise action plan. In addition, safety concerns and other considerations for exercise and cardiac rehab will be addressed by the presenter. The purpose of this lesson is to promote a comprehensive and effective weekly exercise routine in order to improve patients' overall level of fitness.   Managing Heart Disease: Your Path to a Healthier Heart Clinical staff led  group instruction and group discussion with PowerPoint presentation and patient guidebook. To enhance the learning environment the use of posters, models and videos may be added.At the conclusion of this workshop, patients will understand the anatomy and physiology of the heart. Additionally,  they will understand how Pritikin's three pillars impact the risk factors, the progression, and the management of heart disease.  The purpose of this lesson is to provide a high-level overview of the heart, heart disease, and how the Pritikin lifestyle positively impacts risk factors.  Exercise Biomechanics Clinical staff led group instruction and group discussion with PowerPoint presentation and patient guidebook. To enhance the learning environment the use of posters, models and videos may be added. Patients will learn how the structural parts of their bodies function and how these functions impact their daily activities, movement, and exercise. Patients will learn how to promote a neutral spine, learn how to manage pain, and identify ways to improve their physical movement in order to promote healthy living. The purpose of this lesson is to expose patients to common physical limitations that impact physical activity. Participants will learn practical ways to adapt and manage aches and pains, and to minimize their effect on regular exercise. Patients will learn how to maintain good posture while sitting, walking, and lifting.  Balance Training and Fall Prevention  Clinical staff led group instruction and group discussion with PowerPoint presentation and patient guidebook. To enhance the learning environment the use of posters, models and videos may be added. At the conclusion of this workshop, patients will understand the importance of their sensorimotor skills (vision, proprioception, and the vestibular system) in maintaining their ability to balance as they age. Patients will apply a variety of balancing  exercises that are appropriate for their current level of function. Patients will understand the common causes for poor balance, possible solutions to these problems, and ways to modify their physical environment in order to minimize their fall risk. The purpose of this lesson is to teach patients about the importance of maintaining balance as they age and ways to minimize their risk of falling.  WORKSHOPS   Nutrition:  Fueling a Ship Broker led group instruction and group discussion with PowerPoint presentation and patient guidebook. To enhance the learning environment the use of posters, models and videos may be added. Patients will review the foundational principles of the Pritikin Eating Plan and understand what constitutes a serving size in each of the food groups. Patients will also learn Pritikin-friendly foods that are better choices when away from home and review make-ahead meal and snack options. Calorie density will be reviewed and applied to three nutrition priorities: weight maintenance, weight loss, and weight gain. The purpose of this lesson is to reinforce (in a group setting) the key concepts around what patients are recommended to eat and how to apply these guidelines when away from home by planning and selecting Pritikin-friendly options. Patients will understand how calorie density may be adjusted for different weight management goals.  Mindful Eating  Clinical staff led group instruction and group discussion with PowerPoint presentation and patient guidebook. To enhance the learning environment the use of posters, models and videos may be added. Patients will briefly review the concepts of the Pritikin Eating Plan and the importance of low-calorie dense foods. The concept of mindful eating will be introduced as well as the importance of paying attention to internal hunger signals. Triggers for non-hunger eating and techniques for dealing with triggers will be explored.  The purpose of this lesson is to provide patients with the opportunity to review the basic principles of the Pritikin Eating Plan, discuss the value of eating mindfully and how to measure internal cues of hunger and fullness using the Hunger Scale. Patients will also discuss  reasons for non-hunger eating and learn strategies to use for controlling emotional eating.  Targeting Your Nutrition Priorities Clinical staff led group instruction and group discussion with PowerPoint presentation and patient guidebook. To enhance the learning environment the use of posters, models and videos may be added. Patients will learn how to determine their genetic susceptibility to disease by reviewing their family history. Patients will gain insight into the importance of diet as part of an overall healthy lifestyle in mitigating the impact of genetics and other environmental insults. The purpose of this lesson is to provide patients with the opportunity to assess their personal nutrition priorities by looking at their family history, their own health history and current risk factors. Patients will also be able to discuss ways of prioritizing and modifying the Pritikin Eating Plan for their highest risk areas  Menu  Clinical staff led group instruction and group discussion with PowerPoint presentation and patient guidebook. To enhance the learning environment the use of posters, models and videos may be added. Using menus brought in from [employer], or printed from [employer], patients will apply the Pritikin dining out guidelines that were presented in the [employer] video. Patients will also be able to practice these guidelines in a variety of provided scenarios. The purpose of this lesson is to provide patients with the opportunity to practice hands-on learning of the Pritikin Dining Out guidelines with actual menus and practice scenarios.  Label Reading Clinical staff led group instruction  and group discussion with PowerPoint presentation and patient guidebook. To enhance the learning environment the use of posters, models and videos may be added. Patients will review and discuss the Pritikin label reading guidelines presented in Pritikin's Label Reading Educational series video. Using fool labels brought in from local grocery stores and markets, patients will apply the label reading guidelines and determine if the packaged food meet the Pritikin guidelines. The purpose of this lesson is to provide patients with the opportunity to review, discuss, and practice hands-on learning of the Pritikin Label Reading guidelines with actual packaged food labels. Cooking School  Pritikin's [employer] are designed to teach patients ways to prepare quick, simple, and affordable recipes at home. The importance of nutrition's role in chronic disease risk reduction is reflected in its emphasis in the overall Pritikin program. By learning how to prepare essential core Pritikin Eating Plan recipes, patients will increase control over what they eat; be able to customize the flavor of foods without the use of added salt, sugar, or fat; and improve the quality of the food they consume. By learning a set of core recipes which are easily assembled, quickly prepared, and affordable, patients are more likely to prepare more healthy foods at home. These workshops focus on convenient breakfasts, simple entres, side dishes, and desserts which can be prepared with minimal effort and are consistent with nutrition recommendations for cardiovascular risk reduction. Cooking [employer] are taught by a armed forces logistics/support/administrative officer (RD) who has been trained by the [employer]. The chef or RD has a clear understanding of the importance of minimizing - if not completely eliminating - added fat, sugar, and sodium in recipes. Throughout the series of Cooking School Workshop sessions, patients will learn  about healthy ingredients and efficient methods of cooking to build confidence in their capability to prepare    Cooking School weekly topics:  Adding Flavor- Sodium-Free  Fast and Healthy Breakfasts  Powerhouse Plant-Based Proteins  Satisfying Salads and Dressings  Simple Sides and Sauces  International Cuisine-Spotlight on the [employer] Zones  Delicious Desserts  Savory Soups  [employer] - Meals in a Snap  Tasty Appetizers and Snacks  Comforting Weekend Breakfasts  One-Pot Wonders   Fast Evening Meals  Landscape Architect Your [facility] Plate  WORKSHOPS   Healthy Mindset (Psychosocial):  Focused Goals, Sustainable Changes Clinical staff led group instruction and group discussion with PowerPoint presentation and patient guidebook. To enhance the learning environment the use of posters, models and videos may be added. Patients will be able to apply effective goal setting strategies to establish at least one personal goal, and then take consistent, meaningful action toward that goal. They will learn to identify common barriers to achieving personal goals and develop strategies to overcome them. Patients will also gain an understanding of how our mind-set can impact our ability to achieve goals and the importance of cultivating a positive and growth-oriented mind-set. The purpose of this lesson is to provide patients with a deeper understanding of how to set and achieve personal goals, as well as the tools and strategies needed to overcome common obstacles which may arise along the way.  From Head to Heart: The Power of a Healthy Outlook  Clinical staff led group instruction and group discussion with PowerPoint presentation and patient guidebook. To enhance the learning environment the use of posters, models and videos may be added. Patients will be able to recognize and describe the impact of emotions and mood on physical health. They will discover the importance of  self-care and explore self-care practices which may work for them. Patients will also learn how to utilize the 4 C's to cultivate a healthier outlook and better manage stress and challenges. The purpose of this lesson is to demonstrate to patients how a healthy outlook is an essential part of maintaining good health, especially as they continue their cardiac rehab journey.  Healthy Sleep for a Healthy Heart Clinical staff led group instruction and group discussion with PowerPoint presentation and patient guidebook. To enhance the learning environment the use of posters, models and videos may be added. At the conclusion of this workshop, patients will be able to demonstrate knowledge of the importance of sleep to overall health, well-being, and quality of life. They will understand the symptoms of, and treatments for, common sleep disorders. Patients will also be able to identify daytime and nighttime behaviors which impact sleep, and they will be able to apply these tools to help manage sleep-related challenges. The purpose of this lesson is to provide patients with a general overview of sleep and outline the importance of quality sleep. Patients will learn about a few of the most common sleep disorders. Patients will also be introduced to the concept of "sleep hygiene," and discover ways to self-manage certain sleeping problems through simple daily behavior changes. Finally, the workshop will motivate patients by clarifying the links between quality sleep and their goals of heart-healthy living.   Recognizing and Reducing Stress Clinical staff led group instruction and group discussion with PowerPoint presentation and patient guidebook. To enhance the learning environment the use of posters, models and videos may be added. At the conclusion of this workshop, patients will be able to understand the types of stress reactions, differentiate between acute and chronic stress, and recognize the impact that chronic  stress has on their health. They will also be able to apply different coping mechanisms, such as reframing negative self-talk. Patients will have the opportunity to practice a variety of  stress management techniques, such as deep abdominal breathing, progressive muscle relaxation, and/or guided imagery.  The purpose of this lesson is to educate patients on the role of stress in their lives and to provide healthy techniques for coping with it.  Learning Barriers/Preferences:  Learning Barriers/Preferences - 05/30/24 0850       Learning Barriers/Preferences   Learning Barriers Exercise Concerns;Sight    Learning Preferences Pictoral;Video          Education Topics:  Knowledge Questionnaire Score:  Knowledge Questionnaire Score - 05/30/24 0854       Knowledge Questionnaire Score   Pre Score 22/24          Core Components/Risk Factors/Patient Goals at Admission:  Personal Goals and Risk Factors at Admission - 05/30/24 0804       Core Components/Risk Factors/Patient Goals on Admission    Weight Management Yes;Obesity;Weight Loss    Intervention Weight Management/Obesity: Establish reasonable short term and long term weight goals.;Obesity: Provide education and appropriate resources to help participant work on and attain dietary goals.    Admit Weight 254 lb 13.6 oz (115.6 kg)    Expected Outcomes Short Term: Continue to assess and modify interventions until short term weight is achieved;Long Term: Adherence to nutrition and physical activity/exercise program aimed toward attainment of established weight goal;Weight Loss: Understanding of general recommendations for a balanced deficit meal plan, which promotes 1-2 lb weight loss per week and includes a negative energy balance of [phone] kcal/d    Lipids Yes    Intervention Provide education and support for participant on nutrition & aerobic/resistive exercise along with prescribed medications to achieve LDL 70mg , HDL >40mg .     Expected Outcomes Short Term: Participant states understanding of desired cholesterol values and is compliant with medications prescribed. Participant is following exercise prescription and nutrition guidelines.;Long Term: Cholesterol controlled with medications as prescribed, with individualized exercise RX and with personalized nutrition plan. Value goals: LDL < 70mg , HDL > 40 mg.    Stress Yes    Intervention Offer individual and/or small group education and counseling on adjustment to heart disease, stress management and health-related lifestyle change. Teach and support self-help strategies.;Refer participants experiencing significant psychosocial distress to appropriate mental health specialists for further evaluation and treatment. When possible, include family members and significant others in education/counseling sessions.    Expected Outcomes Short Term: Participant demonstrates changes in health-related behavior, relaxation and other stress management skills, ability to obtain effective social support, and compliance with psychotropic medications if prescribed.;Long Term: Emotional wellbeing is indicated by absence of clinically significant psychosocial distress or social isolation.          Core Components/Risk Factors/Patient Goals Review:   Goals and Risk Factor Review     Row Name 06/06/24 0920 07/05/24 0831           Core Components/Risk Factors/Patient Goals Review   Personal Goals Review Weight Management/Obesity;Lipids;Stress Weight Management/Obesity;Lipids;Stress      Review Benjamine is doing well with exercise at cardiac rehab. Vital signs and CBG's have been stable. Treyveon is doing well with exercise at cardiac rehab. CBG's and VSS.      Expected Outcomes Dara will continue to particpate in cardiac rehab for exercise, nutrition and lifestyle modifications Hoke will continue to particpate in cardiac rehab for exercise, nutrition and lifestyle modifications          Core Components/Risk Factors/Patient Goals at Discharge (Final Review):   Goals and Risk Factor Review - 07/05/24 9168  Core Components/Risk Factors/Patient Goals Review   Personal Goals Review Weight Management/Obesity;Lipids;Stress    Review Mykell is doing well with exercise at cardiac rehab. CBG's and VSS.    Expected Outcomes Maureen will continue to particpate in cardiac rehab for exercise, nutrition and lifestyle modifications          ITP Comments:  ITP Comments     Row Name 05/30/24 0801 06/05/24 0857 07/05/24 9177       ITP Comments Medical Director- Dr. Wilbert Bihari, MD. Introduction to the [facility] Education/ Intensive Cardiac Rehab Program. Reviewed initial orientation folder with [facility]. 30 Day ITP review.  Gregoire started cardiac rehab today (06/05/24) and tolerated exercise well. 30 Day ITP review.  Markell is tolerating exercise well at cardiac rehab        Comments: see ITP comments

## 2024-07-12 ENCOUNTER — Encounter (HOSPITAL_COMMUNITY)
Admission: RE | Admit: 2024-07-12 | Discharge: 2024-07-12 | Disposition: A | Discharge status: Home / Self Care | Source: Ambulatory Visit | Attending: Cardiovascular Disease | Admitting: Cardiovascular Disease

## 2024-07-12 DIAGNOSIS — I252 Old myocardial infarction: Secondary | ICD-10-CM | POA: Insufficient documentation

## 2024-07-12 DIAGNOSIS — Z5189 Encounter for other specified aftercare: Secondary | ICD-10-CM | POA: Insufficient documentation

## 2024-07-12 DIAGNOSIS — I213 ST elevation (STEMI) myocardial infarction of unspecified site: Secondary | ICD-10-CM

## 2024-07-14 ENCOUNTER — Encounter (HOSPITAL_COMMUNITY)
Admission: RE | Admit: 2024-07-14 | Discharge: 2024-07-14 | Disposition: A | Discharge status: Home / Self Care | Source: Ambulatory Visit | Attending: Cardiovascular Disease | Admitting: Cardiovascular Disease

## 2024-07-14 DIAGNOSIS — I213 ST elevation (STEMI) myocardial infarction of unspecified site: Secondary | ICD-10-CM

## 2024-07-17 ENCOUNTER — Encounter (HOSPITAL_COMMUNITY): Admission: RE | Admit: 2024-07-17 | Source: Ambulatory Visit

## 2024-07-19 ENCOUNTER — Encounter (HOSPITAL_COMMUNITY)
Admission: RE | Admit: 2024-07-19 | Discharge: 2024-07-19 | Disposition: A | Discharge status: Home / Self Care | Source: Ambulatory Visit | Attending: Cardiovascular Disease | Admitting: Cardiovascular Disease

## 2024-07-19 DIAGNOSIS — I213 ST elevation (STEMI) myocardial infarction of unspecified site: Secondary | ICD-10-CM

## 2024-07-21 ENCOUNTER — Encounter (HOSPITAL_COMMUNITY)
Admission: RE | Admit: 2024-07-21 | Discharge: 2024-07-21 | Disposition: A | Discharge status: Home / Self Care | Source: Ambulatory Visit | Attending: Cardiovascular Disease | Admitting: Cardiovascular Disease

## 2024-07-21 DIAGNOSIS — I213 ST elevation (STEMI) myocardial infarction of unspecified site: Secondary | ICD-10-CM

## 2024-07-24 ENCOUNTER — Encounter (HOSPITAL_COMMUNITY)
Admission: RE | Admit: 2024-07-24 | Discharge: 2024-07-24 | Disposition: A | Discharge status: Home / Self Care | Source: Ambulatory Visit | Attending: Cardiovascular Disease | Admitting: Cardiovascular Disease

## 2024-07-24 DIAGNOSIS — I213 ST elevation (STEMI) myocardial infarction of unspecified site: Secondary | ICD-10-CM

## 2024-07-26 ENCOUNTER — Encounter (HOSPITAL_COMMUNITY): Admission: RE | Admit: 2024-07-26 | Source: Ambulatory Visit

## 2024-07-28 ENCOUNTER — Encounter (HOSPITAL_COMMUNITY)
Admission: RE | Admit: 2024-07-28 | Discharge: 2024-07-28 | Disposition: A | Discharge status: Home / Self Care | Source: Ambulatory Visit | Attending: Cardiovascular Disease | Admitting: Cardiovascular Disease

## 2024-07-28 DIAGNOSIS — I213 ST elevation (STEMI) myocardial infarction of unspecified site: Secondary | ICD-10-CM

## 2024-07-31 ENCOUNTER — Encounter (HOSPITAL_COMMUNITY)
Admission: RE | Admit: 2024-07-31 | Discharge: 2024-07-31 | Disposition: A | Discharge status: Home / Self Care | Source: Ambulatory Visit | Attending: Cardiovascular Disease | Admitting: Cardiovascular Disease

## 2024-07-31 DIAGNOSIS — I213 ST elevation (STEMI) myocardial infarction of unspecified site: Secondary | ICD-10-CM

## 2024-08-02 ENCOUNTER — Encounter (HOSPITAL_COMMUNITY)

## 2024-08-04 ENCOUNTER — Encounter (HOSPITAL_COMMUNITY)

## 2024-08-07 ENCOUNTER — Encounter (HOSPITAL_COMMUNITY)

## 2024-08-09 ENCOUNTER — Encounter (HOSPITAL_COMMUNITY)

## 2024-08-09 ENCOUNTER — Encounter (HOSPITAL_COMMUNITY): Payer: Self-pay

## 2024-08-09 NOTE — Progress Notes (Signed)
 Cardiac Individual Treatment Plan  Patient Details  Name: Keith Case MRN: [id number] Date of Birth: 1976-06-25 Referring Provider:   Flowsheet Row INTENSIVE CARDIAC REHAB ORIENT from 05/30/2024 in [facility], V[facility], & Lung [facility]  Referring Provider Verlin Lonni BIRCH, MD    Initial Encounter Date:  Flowsheet Row INTENSIVE CARDIAC REHAB ORIENT from 05/30/2024 in [facility] for Heart, Vascular, & Lung Health  Date 05/30/24    Visit Diagnosis: No diagnosis found.  Patient's Home Medications on Admission:  Current Outpatient Medications:    acetaminophen  (TYLENOL ) 500 MG tablet, Take 1,000 mg by mouth every 8 (eight) hours as needed for mild pain (pain score 1-3) or moderate pain (pain score 4-6)., Disp: , Rfl:    apixaban  (ELIQUIS ) 5 MG TABS tablet, Take 5 mg by mouth 2 (two) times daily., Disp: , Rfl:    aspirin  81 MG chewable tablet, Chew 1 tablet (81 mg total) by mouth daily., Disp: 30 tablet, Rfl: 0   atorvastatin  (LIPITOR) 80 MG tablet, Take 1 tablet (80 mg total) by mouth daily., Disp: 90 tablet, Rfl: 2   clopidogrel  (PLAVIX ) 75 MG tablet, Take 1 tablet (75 mg total) by mouth daily., Disp: 90 tablet, Rfl: 2   nitroGLYCERIN  (NITROSTAT ) 0.4 MG SL tablet, Place 1 tablet (0.4 mg total) under the tongue every 5 (five) minutes as needed., Disp: 25 tablet, Rfl: 2  Past Medical History: Past Medical History:  Diagnosis Date   Chicken pox    Coronary artery disease    DVT (deep venous thrombosis) (HCC)    Hyperlipidemia     Tobacco Use: Social History   Tobacco Use  Smoking Status Never  Smokeless Tobacco Not on file    Labs: Review Flowsheet       Latest Ref Rng & Units 02/11/2011 03/27/2011 04/24/2024  Labs for ITP Cardiac and Pulmonary Rehab  Cholestrol 0 - 200 mg/dL - 833  849   LDL (calc) 0 - 99 mg/dL - 897  895   HDL-C >59 mg/dL - 30  34   Trlycerides <150 mg/dL - 827  58   Hemoglobin A1c 4.8  - 5.6 % - - 5.2   TCO2 22 - 32 mmol/L 24  - 21      Exercise Target Goals: Exercise Program Goal: Individual exercise prescription set using results from initial 6 min walk test and THRR while considering  patient's activity barriers and safety.   Exercise Prescription Goal: Initial exercise prescription builds to 30-45 minutes a day of aerobic activity, 2-3 days per week.  Home exercise guidelines will be given to patient during program as part of exercise prescription that the participant will acknowledge.   Education: Aerobic Exercise: - Group verbal and visual presentation on the components of exercise prescription. Introduces F.I.T.T principle from ACSM for exercise prescriptions.  Reviews F.I.T.T. principles of aerobic exercise including progression. Written material provided at class time.   Education: Resistance Exercise: - Group verbal and visual presentation on the components of exercise prescription. Introduces F.I.T.T principle from ACSM for exercise prescriptions  Reviews F.I.T.T. principles of resistance exercise including progression. Written material provided at class time.    Education: Exercise & Equipment Safety: - Individual verbal instruction and demonstration of equipment use and safety with use of the equipment.   Education: Exercise Physiology & General Exercise Guidelines: - Group verbal and written instruction with models to review the exercise physiology of the cardiovascular system and associated critical values. Provides general  exercise guidelines with specific guidelines to those with heart or lung disease. Written material provided at class time.   Education: Flexibility, Balance, Mind/Body Relaxation: - Group verbal and visual presentation with interactive activity on the components of exercise prescription. Introduces F.I.T.T principle from ACSM for exercise prescriptions. Reviews F.I.T.T. principles of flexibility and balance exercise training  including progression. Also discusses the mind body connection.  Reviews various relaxation techniques to help reduce and manage stress (i.e. Deep breathing, progressive muscle relaxation, and visualization). Balance handout provided to take home. Written material provided at class time.   Activity Barriers & Risk Stratification:   6 Minute Walk:   Oxygen Initial Assessment:   Oxygen Re-Evaluation:   Oxygen Discharge (Final Oxygen Re-Evaluation):   Initial Exercise Prescription:   Perform Capillary Blood Glucose checks as needed.  Exercise Prescription Changes:   Exercise Prescription Changes     Row Name 06/30/24 [id number]             Response to Exercise   Blood Pressure (Admit) 114/78       Blood Pressure (Exit) 110/66       Heart Rate (Admit) 69 bpm       Heart Rate (Exercise) 95 bpm       Heart Rate (Exit) 67 bpm       Rating of Perceived Exertion (Exercise) 10       Perceived Dyspnea (Exercise) 0       Symptoms none       Comments Reviewed MET's, goals and home ExRx       Duration Progress to 30 minutes of  aerobic without signs/symptoms of physical distress       Intensity THRR unchanged         Progression   Progression Continue to progress workloads to maintain intensity without signs/symptoms of physical distress.       Average METs 1.85         Resistance Training   Training Prescription Yes       Weight 3 lbs       Reps 10-15       Time 10 Minutes         NuStep   Level 3       SPM 81       Minutes 15       METs 1.9         Arm Ergometer   Level 2.5       Watts 18       RPM 47       Minutes 15       METs 1.8         Home Exercise Plan   Plans to continue exercise at Home (comment)       Frequency Add 2 additional days to program exercise sessions.       Initial Home Exercises Provided 06/30/24          Exercise Comments:   Exercise Comments     Row Name 06/30/24 0848 07/14/24 1435 08/07/24 1659       Exercise Comments Reviewed  MET's, goals and home ExRx. Pt tolerated exercise well with an average MET level of 1.85. He is doing well and progressing MET's and WL's. Pt feel's good about his goals and is increasing endurance, he says he's able to do more around the house and his PT is going very well. He is going to check with his PT for guidance on joining a gym, but I have  a few ideas if they clear him. Right now he is walking about 30 mins 2 days a week and PT 2-3 days. Reviewed MET's. Pt tolerated exercise well with an average MET level of 2.1. He is doing well and progressing MET's and WL's. He says he had worried if this would be too much with working and going to PT, but he has been pleasently supprised that he has more energy and feels he can do all he needs to and more. Congratulated him on his progress Patient traveling for the holiday. Will review education when he returns        Exercise Goals and Review:   Exercise Goals Re-Evaluation :  Exercise Goals Re-Evaluation     Row Name 06/30/24 0844             Exercise Goal Re-Evaluation   Exercise Goals Review Increase Physical Activity;Understanding of Exercise Prescription;Increase Strength and Stamina;Knowledge and understanding of Target Heart Rate Range (THRR);Able to understand and use rate of perceived exertion (RPE) scale       Comments Reviewed MET's, goals and home ExRx. Pt tolerated exercise well with an average MET level of 1.85. He is doing well and progressing MET's and WL's. Pt feel's good about his goals and is increasing endurance, he says he's able to do more around the house and his PT is going very well. He is going to check with his PT for guidance on joining a gym, but I have a few ideas if they clear him. Right now he is walking about 30 mins 2 days a week and PT 2-3 days.       Expected Outcomes Will continue to monitor pt and progress workloads as tolerated without sign or symptom          Discharge Exercise Prescription (Final Exercise  Prescription Changes):  Exercise Prescription Changes - 06/30/24 0841       Response to Exercise   Blood Pressure (Admit) 114/78    Blood Pressure (Exit) 110/66    Heart Rate (Admit) 69 bpm    Heart Rate (Exercise) 95 bpm    Heart Rate (Exit) 67 bpm    Rating of Perceived Exertion (Exercise) 10    Perceived Dyspnea (Exercise) 0    Symptoms none    Comments Reviewed MET's, goals and home ExRx    Duration Progress to 30 minutes of  aerobic without signs/symptoms of physical distress    Intensity THRR unchanged      Progression   Progression Continue to progress workloads to maintain intensity without signs/symptoms of physical distress.    Average METs 1.85      Resistance Training   Training Prescription Yes    Weight 3 lbs    Reps 10-15    Time 10 Minutes      NuStep   Level 3    SPM 81    Minutes 15    METs 1.9      Arm Ergometer   Level 2.5    Watts 18    RPM 47    Minutes 15    METs 1.8      Home Exercise Plan   Plans to continue exercise at Home (comment)    Frequency Add 2 additional days to program exercise sessions.    Initial Home Exercises Provided 06/30/24          Nutrition:  Target Goals: Understanding of nutrition guidelines, daily intake of sodium 1500mg , cholesterol 200mg , calories 30% from fat and 7%  or less from saturated fats, daily to have 5 or more servings of fruits and vegetables.  Education: Nutrition 1 -Group instruction provided by verbal, written material, interactive activities, discussions, models, and posters to present general guidelines for heart healthy nutrition including macronutrients, label reading, and promoting whole foods over processed counterparts. Education serves as pensions consultant of discussion of heart healthy eating for all. Written material provided at class time.    Education: Nutrition 2 -Group instruction provided by verbal, written material, interactive activities, discussions, models, and posters to present  general guidelines for heart healthy nutrition including sodium, cholesterol, and saturated fat. Providing guidance of habit forming to improve blood pressure, cholesterol, and body weight. Written material provided at class time.     Biometrics:    Nutrition Therapy Plan and Nutrition Goals:   Nutrition Assessments:  MEDIFICTS Score Key: >=70 Need to make dietary changes  40-70 Heart Healthy Diet <= 40 Therapeutic Level Cholesterol Diet  Flowsheet Row INTENSIVE CARDIAC REHAB from 06/05/2024 in [facility] for Heart, Vascular, & Lung Health  Picture Your Plate Total Score on Admission 58   Picture Your Plate Scores: <59 Unhealthy dietary pattern with much room for improvement. 41-50 Dietary pattern unlikely to meet recommendations for good health and room for improvement. 51-60 More healthful dietary pattern, with some room for improvement.  >60 Healthy dietary pattern, although there may be some specific behaviors that could be improved.    Nutrition Goals Re-Evaluation:   Nutrition Goals Discharge (Final Nutrition Goals Re-Evaluation):   Psychosocial: Target Goals: Acknowledge presence or absence of significant depression and/or stress, maximize coping skills, provide positive support system. Participant is able to verbalize types and ability to use techniques and skills needed for reducing stress and depression.   Education: Stress, Anxiety, and Depression - Group verbal and visual presentation to define topics covered.  Reviews how body is impacted by stress, anxiety, and depression.  Also discusses healthy ways to reduce stress and to treat/manage anxiety and depression. Written material provided at class time.   Education: Sleep Hygiene -Provides group verbal and written instruction about how sleep can affect your health.  Define sleep hygiene, discuss sleep cycles and impact of sleep habits. Review good sleep hygiene tips.   Initial Review &  Psychosocial Screening:   Quality of Life Scores:   Scores of 19 and below usually indicate a poorer quality of life in these areas.  A difference of  2-3 points is a clinically meaningful difference.  A difference of 2-3 points in the total score of the Quality of Life Index has been associated with significant improvement in overall quality of life, self-image, physical symptoms, and general health in studies assessing change in quality of life.  PHQ-9: Review Flowsheet       05/30/2024  Depression screen PHQ 2/9  Decreased Interest 0  Down, Depressed, Hopeless 0  PHQ - 2 Score 0  Altered sleeping 0  Tired, decreased energy 0  Change in appetite 0  Feeling bad or failure about yourself  0  Trouble concentrating 0  Moving slowly or fidgety/restless 0  Suicidal thoughts 0  PHQ-9 Score 0   Difficult doing work/chores Not difficult at all    Details       Data saved with a previous flowsheet row definition        Interpretation of Total Score  Total Score Depression Severity:  1-4 = Minimal depression, 5-9 = Mild depression, 10-14 = Moderate depression, 15-19 = Moderately severe  depression, 20-27 = Severe depression   Psychosocial Evaluation and Intervention:   Psychosocial Re-Evaluation:  Psychosocial Re-Evaluation     Row Name 07/05/24 0826 08/01/24 1454           Psychosocial Re-Evaluation   Current issues with Current Stress Concerns Current Stress Concerns      Comments Desmond has not voiced any additional pyschosocial stressors or concerns during exercise. Alper has not voiced any additional pyschosocial stressors or concerns during exercise.      Expected Outcomes Adryen will continue to manage or reduce his stress levels through participation in cardiac rehab. Gorge will continue to manage or reduce his stress levels through participation in cardiac rehab.      Interventions Encouraged to attend Cardiac Rehabilitation for the exercise;Stress management  education;Relaxation education Encouraged to attend Cardiac Rehabilitation for the exercise;Stress management education;Relaxation education      Continue Psychosocial Services  No Follow up required No Follow up required         Psychosocial Discharge (Final Psychosocial Re-Evaluation):  Psychosocial Re-Evaluation - 08/01/24 1454       Psychosocial Re-Evaluation   Current issues with Current Stress Concerns    Comments Barett has not voiced any additional pyschosocial stressors or concerns during exercise.    Expected Outcomes Jacinto will continue to manage or reduce his stress levels through participation in cardiac rehab.    Interventions Encouraged to attend Cardiac Rehabilitation for the exercise;Stress management education;Relaxation education    Continue Psychosocial Services  No Follow up required          Vocational Rehabilitation: Provide vocational rehab assistance to qualifying candidates.   Vocational Rehab Evaluation & Intervention:   Education: Education Goals: Education classes will be provided on a variety of topics geared toward better understanding of heart health and risk factor modification. Participant will state understanding/return demonstration of topics presented as noted by education test scores.  Learning Barriers/Preferences:   General Cardiac Education Topics:  AED/CPR: - Group verbal and written instruction with the use of models to demonstrate the basic use of the AED with the basic ABC's of resuscitation.   Test and Procedures: - Group verbal and visual presentation and models provide information about basic cardiac anatomy and function. Reviews the testing methods done to diagnose heart disease and the outcomes of the test results. Describes the treatment choices: Medical Management, Angioplasty, or Coronary Bypass Surgery for treating various heart conditions including Myocardial Infarction, Angina, Valve Disease, and Cardiac Arrhythmias.  Written material provided at class time.   Medication Safety: - Group verbal and visual instruction to review commonly prescribed medications for heart and lung disease. Reviews the medication, class of the drug, and side effects. Includes the steps to properly store meds and maintain the prescription regimen. Written material provided at class time.   Intimacy: - Group verbal instruction through game format to discuss how heart and lung disease can affect sexual intimacy. Written material provided at class time.   Know Your Numbers and Heart Failure: - Group verbal and visual instruction to discuss disease risk factors for cardiac and pulmonary disease and treatment options.  Reviews associated critical values for Overweight/Obesity, Hypertension, Cholesterol, and Diabetes.  Discusses basics of heart failure: signs/symptoms and treatments.  Introduces Heart Failure Zone chart for action plan for heart failure. Written material provided at class time.   Infection Prevention: - Provides verbal and written material to individual with discussion of infection control including proper hand washing and proper equipment cleaning during exercise session.  Falls Prevention: - Provides verbal and written material to individual with discussion of falls prevention and safety.   Other: -Provides group and verbal instruction on various topics (see comments)   Knowledge Questionnaire Score:   Core Components/Risk Factors/Patient Goals at Admission:   Education:Diabetes - Individual verbal and written instruction to review signs/symptoms of diabetes, desired ranges of glucose level fasting, after meals and with exercise. Acknowledge that pre and post exercise glucose checks will be done for 3 sessions at entry of program.   Core Components/Risk Factors/Patient Goals Review:   Goals and Risk Factor Review     Row Name 07/05/24 0831 08/01/24 1455           Core Components/Risk  Factors/Patient Goals Review   Personal Goals Review Weight Management/Obesity;Lipids;Stress Weight Management/Obesity;Lipids;Stress      Review Theotis is doing well with exercise at cardiac rehab. CBG's and VSS. Sargon is doing well with exercise at cardiac rehab when in attendance. CBG's and VSS.      Expected Outcomes Mikael will continue to particpate in cardiac rehab for exercise, nutrition and lifestyle modifications Eulice will continue to particpate in cardiac rehab for exercise, nutrition and lifestyle modifications         Core Components/Risk Factors/Patient Goals at Discharge (Final Review):   Goals and Risk Factor Review - 08/01/24 1455       Core Components/Risk Factors/Patient Goals Review   Personal Goals Review Weight Management/Obesity;Lipids;Stress    Review Adalid is doing well with exercise at cardiac rehab when in attendance. CBG's and VSS.    Expected Outcomes Hinton will continue to particpate in cardiac rehab for exercise, nutrition and lifestyle modifications          ITP Comments:  ITP Comments     Row Name 07/05/24 0822 08/01/24 1454         ITP Comments 30 Day ITP review.  Kayl is tolerating exercise well at cardiac rehab 30 Day ITP review.  Sher continues to tolerate exercise well at cardiac rehab         Comments: see ITP comments

## 2024-08-11 ENCOUNTER — Encounter (HOSPITAL_COMMUNITY)
Admission: RE | Admit: 2024-08-11 | Discharge: 2024-08-11 | Disposition: A | Discharge status: Home / Self Care | Source: Ambulatory Visit | Attending: Cardiovascular Disease | Admitting: Cardiovascular Disease

## 2024-08-11 DIAGNOSIS — I213 ST elevation (STEMI) myocardial infarction of unspecified site: Secondary | ICD-10-CM | POA: Insufficient documentation

## 2024-08-14 ENCOUNTER — Encounter (HOSPITAL_COMMUNITY)
Admission: RE | Admit: 2024-08-14 | Discharge: 2024-08-14 | Disposition: A | Discharge status: Home / Self Care | Source: Ambulatory Visit | Attending: Cardiovascular Disease | Admitting: Cardiovascular Disease

## 2024-08-14 DIAGNOSIS — I213 ST elevation (STEMI) myocardial infarction of unspecified site: Secondary | ICD-10-CM | POA: Diagnosis not present

## 2024-08-15 ENCOUNTER — Ambulatory Visit: Admitting: Cardiovascular Disease

## 2024-08-16 ENCOUNTER — Encounter (HOSPITAL_COMMUNITY)
Admission: RE | Admit: 2024-08-16 | Discharge: 2024-08-16 | Disposition: A | Discharge status: Home / Self Care | Source: Ambulatory Visit | Attending: Cardiovascular Disease | Admitting: Cardiovascular Disease

## 2024-08-16 DIAGNOSIS — I213 ST elevation (STEMI) myocardial infarction of unspecified site: Secondary | ICD-10-CM | POA: Diagnosis not present

## 2024-08-18 ENCOUNTER — Encounter (HOSPITAL_COMMUNITY): Admission: RE | Admit: 2024-08-18 | Source: Ambulatory Visit

## 2024-08-21 ENCOUNTER — Encounter (HOSPITAL_COMMUNITY)
Admission: RE | Admit: 2024-08-21 | Discharge: 2024-08-21 | Disposition: A | Discharge status: Home / Self Care | Source: Ambulatory Visit | Attending: Cardiovascular Disease | Admitting: Cardiovascular Disease

## 2024-08-21 DIAGNOSIS — I213 ST elevation (STEMI) myocardial infarction of unspecified site: Secondary | ICD-10-CM | POA: Diagnosis not present

## 2024-08-23 ENCOUNTER — Encounter (HOSPITAL_COMMUNITY)
Admission: RE | Admit: 2024-08-23 | Discharge: 2024-08-23 | Disposition: A | Discharge status: Home / Self Care | Source: Ambulatory Visit | Attending: Cardiovascular Disease | Admitting: Cardiovascular Disease

## 2024-08-23 DIAGNOSIS — I213 ST elevation (STEMI) myocardial infarction of unspecified site: Secondary | ICD-10-CM | POA: Diagnosis not present

## 2024-08-25 ENCOUNTER — Encounter (HOSPITAL_COMMUNITY)
Admission: RE | Admit: 2024-08-25 | Discharge: 2024-08-25 | Disposition: A | Discharge status: Home / Self Care | Source: Ambulatory Visit | Attending: Cardiovascular Disease | Admitting: Cardiovascular Disease

## 2024-08-25 DIAGNOSIS — I213 ST elevation (STEMI) myocardial infarction of unspecified site: Secondary | ICD-10-CM

## 2024-08-28 ENCOUNTER — Encounter (HOSPITAL_COMMUNITY)
Admission: RE | Admit: 2024-08-28 | Discharge: 2024-08-28 | Disposition: A | Discharge status: Home / Self Care | Source: Ambulatory Visit | Attending: Cardiovascular Disease | Admitting: Cardiovascular Disease

## 2024-08-28 DIAGNOSIS — I213 ST elevation (STEMI) myocardial infarction of unspecified site: Secondary | ICD-10-CM

## 2024-08-30 ENCOUNTER — Encounter (HOSPITAL_COMMUNITY)

## 2024-09-01 ENCOUNTER — Encounter (HOSPITAL_COMMUNITY)

## 2024-09-04 ENCOUNTER — Encounter (HOSPITAL_COMMUNITY)
Admission: RE | Admit: 2024-09-04 | Discharge: 2024-09-04 | Disposition: A | Discharge status: Home / Self Care | Source: Ambulatory Visit | Attending: Cardiovascular Disease | Admitting: Cardiovascular Disease

## 2024-09-04 DIAGNOSIS — I213 ST elevation (STEMI) myocardial infarction of unspecified site: Secondary | ICD-10-CM

## 2024-09-05 NOTE — Progress Notes (Signed)
 Cardiac Individual Treatment Plan  Patient Details  Name: Keith Case MRN: [id number] Date of Birth: 1976/03/07 Referring Provider:   Flowsheet Row INTENSIVE CARDIAC REHAB ORIENT from 05/30/2024 in [facility]  Referring Provider Keith Lonni BIRCH, MD    Initial Encounter Date:  Flowsheet Row INTENSIVE CARDIAC REHAB ORIENT from 05/30/2024 in [facility] for Heart, Vascular, & Lung Health  Date 05/30/24    Visit Diagnosis: 04/24/24 STEMI  Patient's Home Medications on Admission: Current Medications[1]  Past Medical History: Past Medical History:  Diagnosis Date   Chicken pox    Coronary artery disease    DVT (deep venous thrombosis) (HCC)    Hyperlipidemia     Tobacco Use: Tobacco Use History[2]  Labs: Review Flowsheet       Latest Ref Rng & Units 02/11/2011 03/27/2011 04/24/2024  Labs for ITP Cardiac and Pulmonary Rehab  Cholestrol 0 - 200 mg/dL - 833  849   LDL (calc) 0 - 99 mg/dL - 897  895   HDL-C >59 mg/dL - 30  34   Trlycerides <150 mg/dL - 827  58   Hemoglobin A1c 4.8 - 5.6 % - - 5.2   TCO2 22 - 32 mmol/L 24  - 21     Capillary Blood Glucose: No results found for: GLUCAP   Exercise Target Goals: Exercise Program Goal: Individual exercise prescription set using results from initial 6 min walk test and THRR while considering  patients activity barriers and safety.   Exercise Prescription Goal: Initial exercise prescription builds to 30-45 minutes a day of aerobic activity, 2-3 days per week.  Home exercise guidelines will be given to patient during program as part of exercise prescription that the participant will acknowledge.  Activity Barriers & Risk Stratification:  Activity Barriers & Cardiac Risk Stratification - 05/30/24 1025       Activity Barriers & Cardiac Risk Stratification   Activity Barriers Balance Concerns;Assistive Device;Left Knee Replacement     Cardiac Risk Stratification High          6 Minute Walk:  6 Minute Walk     Row Name 05/30/24 0939         6 Minute Walk   Phase Initial     Distance 746 feet     Walk Time 6 minutes     # of Rest Breaks 0     MPH 1.41     METS 2.67     RPE 11     Perceived Dyspnea  0     VO2 Peak 9.35     Symptoms No     Resting HR 94 bpm     Resting BP 118/82     Resting Oxygen Saturation  95 %     Exercise Oxygen Saturation  during 6 min walk 97 %     Max Ex. HR 95 bpm     Max Ex. BP 130/90     2 Minute Post BP 118/94        Oxygen Initial Assessment:   Oxygen Re-Evaluation:   Oxygen Discharge (Final Oxygen Re-Evaluation):   Initial Exercise Prescription:  Initial Exercise Prescription - 05/30/24 1000       Date of Initial Exercise RX and Referring Provider   Date 05/30/24    Referring Provider Keith Lonni BIRCH, MD    Expected Discharge Date 08/23/24      NuStep   Level 1    SPM  85    Minutes 15    METs 2.5      Arm Ergometer   Level 2    Watts 15    RPM 25    Minutes 15    METs 2      Prescription Details   Frequency (times per week) 3    Duration Progress to 30 minutes of continuous aerobic without signs/symptoms of physical distress      Intensity   THRR 40-80% of Max Heartrate 69-138    Ratings of Perceived Exertion 11-13    Perceived Dyspnea 0-4      Progression   Progression Continue to progress workloads to maintain intensity without signs/symptoms of physical distress.      Resistance Training   Training Prescription Yes    Weight 3 lbs    Reps 10-15          Perform Capillary Blood Glucose checks as needed.  Exercise Prescription Changes:   Exercise Prescription Changes     Row Name 06/05/24 0834 06/30/24 0841 08/11/24 0800         Response to Exercise   Blood Pressure (Admit) 120/94 114/78 124/84     Blood Pressure (Exercise) 124/88 -- --     Blood Pressure (Exit) 132/86 110/66 120/78     Heart Rate (Admit) 82  bpm 69 bpm 71 bpm     Heart Rate (Exercise) 98 bpm 95 bpm 98 bpm     Heart Rate (Exit) 91 bpm 67 bpm 76 bpm     Rating of Perceived Exertion (Exercise) 9 10 10      Perceived Dyspnea (Exercise) 0 0 0     Symptoms none none none     Comments Pt first day in the [employer] program Reviewed MET's, goals and home ExRx Reviewed MET's, goals and home ExRx     Duration Progress to 30 minutes of  aerobic without signs/symptoms of physical distress Progress to 30 minutes of  aerobic without signs/symptoms of physical distress Progress to 30 minutes of  aerobic without signs/symptoms of physical distress     Intensity THRR unchanged THRR unchanged THRR unchanged       Progression   Progression Continue to progress workloads to maintain intensity without signs/symptoms of physical distress. Continue to progress workloads to maintain intensity without signs/symptoms of physical distress. Continue to progress workloads to maintain intensity without signs/symptoms of physical distress.     Average METs 1.9 1.85 2       Resistance Training   Training Prescription Yes Yes Yes     Weight 3 lbs 3 lbs 3 lbs     Reps 10-15 10-15 10-15     Time 10 Minutes 10 Minutes 10 Minutes       NuStep   Level 1 3 4      SPM 56 81 83     Minutes 15 15 15      METs 1.6 1.9 2.1       Arm Ergometer   Level 2 2.5 2.5     Watts 16 18 20      RPM 45 47 --     Minutes 15 15 15      METs 1.6 1.8 1.9       Home Exercise Plan   Plans to continue exercise at -- Home (comment) --     Frequency -- Add 2 additional days to program exercise sessions. --     Initial Home Exercises Provided -- 06/30/24 --  Exercise Comments:   Exercise Comments     Row Name 06/05/24 0839 06/30/24 0848 07/14/24 1435 08/07/24 1659 08/23/24 0853   Exercise Comments Pt first day in the [facility] program. Pt tolerated exercise well with an average MET level of 1.6. He is off to a good start and is learning his THRR, RPE and ExRx Reviewed  MET's, goals and home ExRx. Pt tolerated exercise well with an average MET level of 1.85. He is doing well and progressing MET's and WL's. Pt feel's good about his goals and is increasing endurance, he says he's able to do more around the house and his PT is going very well. He is going to check with his PT for guidance on joining a gym, but I have a few ideas if they clear him. Right now he is walking about 30 mins 2 days a week and PT 2-3 days. Reviewed MET's. Pt tolerated exercise well with an average MET level of 2.1. He is doing well and progressing MET's and WL's. He says he had worried if this would be too much with working and going to PT, but he has been pleasently supprised that he has more energy and feels he can do all he needs to and more. Congratulated him on his progress Patient traveling for the holiday. Will review education when he returns Reviewed MET's. Pt tolerated exercise well with an average MET level of 2.1. He is doing well with exercise    Row Name 08/24/24 0851           Exercise Comments --          Exercise Goals and Review:   Exercise Goals     Row Name 05/30/24 1025             Exercise Goals   Increase Physical Activity Yes       Intervention Provide advice, education, support and counseling about physical activity/exercise needs.;Develop an individualized exercise prescription for aerobic and resistive training based on initial evaluation findings, risk stratification, comorbidities and participant's personal goals.       Expected Outcomes Short Term: Attend rehab on a regular basis to increase amount of physical activity.;Long Term: Exercising regularly at least 3-5 days a week.;Long Term: Add in home exercise to make exercise part of routine and to increase amount of physical activity.       Increase Strength and Stamina Yes       Intervention Provide advice, education, support and counseling about physical activity/exercise needs.;Develop an  individualized exercise prescription for aerobic and resistive training based on initial evaluation findings, risk stratification, comorbidities and participant's personal goals.       Expected Outcomes Short Term: Increase workloads from initial exercise prescription for resistance, speed, and METs.;Short Term: Perform resistance training exercises routinely during rehab and add in resistance training at home;Long Term: Improve cardiorespiratory fitness, muscular endurance and strength as measured by increased METs and functional capacity ( )       Able to understand and use rate of perceived exertion (RPE) scale Yes       Intervention Provide education and explanation on how to use RPE scale       Expected Outcomes Short Term: Able to use RPE daily in rehab to express subjective intensity level;Long Term:  Able to use RPE to guide intensity level when exercising independently       Knowledge and understanding of Target Heart Rate Range (THRR) Yes       Intervention Provide education  and explanation of THRR including how the numbers were predicted and where they are located for reference       Expected Outcomes Short Term: Able to state/look up THRR;Long Term: Able to use THRR to govern intensity when exercising independently;Short Term: Able to use daily as guideline for intensity in rehab       Able to check pulse independently Yes       Intervention Provide education and demonstration on how to check pulse in carotid and radial arteries.;Review the importance of being able to check your own pulse for safety during independent exercise       Expected Outcomes Short Term: Able to explain why pulse checking is important during independent exercise;Long Term: Able to check pulse independently and accurately       Understanding of Exercise Prescription Yes       Intervention Provide education, explanation, and written materials on patient's individual exercise prescription       Expected Outcomes  Short Term: Able to explain program exercise prescription;Long Term: Able to explain home exercise prescription to exercise independently          Exercise Goals Re-Evaluation :  Exercise Goals Re-Evaluation     Row Name 06/05/24 0837 06/30/24 0844 08/11/24 0825         Exercise Goal Re-Evaluation   Exercise Goals Review Increase Physical Activity;Understanding of Exercise Prescription;Increase Strength and Stamina;Knowledge and understanding of Target Heart Rate Range (THRR);Able to understand and use rate of perceived exertion (RPE) scale Increase Physical Activity;Understanding of Exercise Prescription;Increase Strength and Stamina;Knowledge and understanding of Target Heart Rate Range (THRR);Able to understand and use rate of perceived exertion (RPE) scale Increase Physical Activity;Understanding of Exercise Prescription;Increase Strength and Stamina;Knowledge and understanding of Target Heart Rate Range (THRR);Able to understand and use rate of perceived exertion (RPE) scale     Comments Pt first day in the [facility] program. Pt tolerated exercise well with an average MET level of 1.6. He is off to a good start and is learning his THRR, RPE and ExRx Reviewed MET's, goals and home ExRx. Pt tolerated exercise well with an average MET level of 1.85. He is doing well and progressing MET's and WL's. Pt feel's good about his goals and is increasing endurance, he says he's able to do more around the house and his PT is going very well. He is going to check with his PT for guidance on joining a gym, but I have a few ideas if they clear him. Right now he is walking about 30 mins 2 days a week and PT 2-3 days. Reviewed MET's and goals. Keith Case exercises for 15 min on the Nustep and arm ergometer. He averages 2.1 METs at level 4 on the Nustep and 1.9 METs at level 2.5 on the arm ergometer. He has increased his level as he tolerates progressions well. Keith Case is still seeing PT 2 days/wk and is back to work  full time. He has missed several sessions due to returning back to work. Pt seems motivated to exercise and has notice improvement in his strength.     Expected Outcomes Will continue to monitor pt and progress workloads as tolerated without sign or symptom Will continue to monitor pt and progress workloads as tolerated without sign or symptom Will continue to monitor pt and progress workloads as tolerated without sign or symptom        Discharge Exercise Prescription (Final Exercise Prescription Changes):  Exercise Prescription Changes - 08/11/24 0800  Response to Exercise   Blood Pressure (Admit) 124/84    Blood Pressure (Exit) 120/78    Heart Rate (Admit) 71 bpm    Heart Rate (Exercise) 98 bpm    Heart Rate (Exit) 76 bpm    Rating of Perceived Exertion (Exercise) 10    Perceived Dyspnea (Exercise) 0    Symptoms none    Comments Reviewed MET's, goals and home ExRx    Duration Progress to 30 minutes of  aerobic without signs/symptoms of physical distress    Intensity THRR unchanged      Progression   Progression Continue to progress workloads to maintain intensity without signs/symptoms of physical distress.    Average METs 2      Resistance Training   Training Prescription Yes    Weight 3 lbs    Reps 10-15    Time 10 Minutes      NuStep   Level 4    SPM 83    Minutes 15    METs 2.1      Arm Ergometer   Level 2.5    Watts 20    Minutes 15    METs 1.9          Nutrition:  Target Goals: Understanding of nutrition guidelines, daily intake of sodium 1500mg , cholesterol 200mg , calories 30% from fat and 7% or less from saturated fats, daily to have 5 or more servings of fruits and vegetables.  Biometrics:  Pre Biometrics - 05/30/24 0801       Pre Biometrics   Waist Circumference 46.5 inches    Hip Circumference 48 inches    Waist to Hip Ratio 0.97 %    Triceps Skinfold 21 mm    % Body Fat 33.5 %    Grip Strength 23 kg    Flexibility --   Not performed.  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   Single Leg Stand 30 seconds           Nutrition Therapy Plan and Nutrition Goals:   Nutrition Assessments:  Nutrition Assessments - 06/05/24 0947       Rate Your Plate Scores   Pre Score 58         MEDIFICTS Score Key: >=70 Need to make dietary changes  40-70 Heart Healthy Diet <= 40 Therapeutic Level Cholesterol Diet   Flowsheet Row INTENSIVE CARDIAC REHAB from 06/05/2024 in [facility]  Picture Your Plate Total Score on Admission 58   Picture Your Plate Scores: <59 Unhealthy dietary pattern with much room for improvement. 41-50 Dietary pattern unlikely to meet recommendations for good health and room for improvement. 51-60 More healthful dietary pattern, with some room for improvement.  >60 Healthy dietary pattern, although there may be some specific behaviors that could be improved.    Nutrition Goals Re-Evaluation:   Nutrition Goals Re-Evaluation:   Nutrition Goals Discharge (Final Nutrition Goals Re-Evaluation):   Psychosocial: Target Goals: Acknowledge presence or absence of significant depression and/or stress, maximize coping skills, provide positive support system. Participant is able to verbalize types and ability to use techniques and skills needed for reducing stress and depression.  Initial Review & Psychosocial Screening:  Initial Psych Review & Screening - 06/05/24 0902       Initial Review   Current issues with Current Stress Concerns    Source of Stress Concerns Financial      Family Dynamics   Good Support System? Yes   pt has good support system  with his wife and 2 daughters     Barriers   Psychosocial barriers to participate in program The patient should benefit from training in stress management and relaxation.      Screening Interventions   Interventions Encouraged to exercise;To provide support and resources with identified psychosocial needs;Provide  feedback about the scores to participant    Expected Outcomes Long Term Goal: Stressors or current issues are controlled or eliminated.;Long Term goal: The participant improves quality of Life and PHQ9 Scores as seen by post scores and/or verbalization of changes;Short Term goal: Identification and review with participant of any Quality of Life or Depression concerns found by scoring the questionnaire.          Quality of Life Scores:  Quality of Life - 05/30/24 0925       Quality of Life   Select Quality of Life      Quality of Life Scores   Health/Function Pre 19.67 %    Socioeconomic Pre 28.29 %    Psych/Spiritual Pre 22.86 %    Family Pre 24 %    GLOBAL Pre 22.74 %         Scores of 19 and below usually indicate a poorer quality of life in these areas.  A difference of  2-3 points is a clinically meaningful difference.  A difference of 2-3 points in the total score of the Quality of Life Index has been associated with significant improvement in overall quality of life, self-image, physical symptoms, and general health in studies assessing change in quality of life.  PHQ-9: Review Flowsheet       05/30/2024  Depression screen PHQ 2/9  Decreased Interest 0  Down, Depressed, Hopeless 0  PHQ - 2 Score 0  Altered sleeping 0  Tired, decreased energy 0  Change in appetite 0  Feeling bad or failure about yourself  0  Trouble concentrating 0  Moving slowly or fidgety/restless 0  Suicidal thoughts 0  PHQ-9 Score 0   Difficult doing work/chores Not difficult at all    Details       Data saved with a previous flowsheet row definition        Interpretation of Total Score  Total Score Depression Severity:  1-4 = Minimal depression, 5-9 = Mild depression, 10-14 = Moderate depression, 15-19 = Moderately severe depression, 20-27 = Severe depression   Psychosocial Evaluation and Intervention:   Psychosocial Re-Evaluation:  Psychosocial Re-Evaluation     Row Name  06/05/24 0902 06/06/24 9081 07/05/24 0826 08/01/24 1454 08/30/24 1637     Psychosocial Re-Evaluation   Current issues with Current Stress Concerns Current Stress Concerns Current Stress Concerns Current Stress Concerns Current Stress Concerns   Comments -- Keith Case did not voice any additional pyschosocial stressors or concerns during exercise. Keith Case has not voiced any additional pyschosocial stressors or concerns during exercise. Keith Case has not voiced any additional pyschosocial stressors or concerns during exercise. Keith Case has not voiced any additional pyschosocial stressors or concerns during exercise.   Expected Outcomes Keith Case will continue to manage or reduce his stress levels through participation in cardiac rehab. Keith Case will continue to manage or reduce his stress levels through participation in cardiac rehab. Keith Case will continue to manage or reduce his stress levels through participation in cardiac rehab. Sael will continue to manage or reduce his stress levels through participation in cardiac rehab. Keith Case will continue to manage or reduce his stress levels through participation in cardiac rehab.   Interventions Encouraged to attend Cardiac Rehabilitation  for the exercise;Stress management education;Relaxation education Encouraged to attend Cardiac Rehabilitation for the exercise;Stress management education;Relaxation education Encouraged to attend Cardiac Rehabilitation for the exercise;Stress management education;Relaxation education Encouraged to attend Cardiac Rehabilitation for the exercise;Stress management education;Relaxation education Encouraged to attend Cardiac Rehabilitation for the exercise;Stress management education;Relaxation education   Continue Psychosocial Services  No Follow up required No Follow up required No Follow up required No Follow up required No Follow up required      Psychosocial Discharge (Final Psychosocial Re-Evaluation):  Psychosocial Re-Evaluation  - 08/30/24 1637       Psychosocial Re-Evaluation   Current issues with Current Stress Concerns    Comments Keith Case has not voiced any additional pyschosocial stressors or concerns during exercise.    Expected Outcomes Keith Case will continue to manage or reduce his stress levels through participation in cardiac rehab.    Interventions Encouraged to attend Cardiac Rehabilitation for the exercise;Stress management education;Relaxation education    Continue Psychosocial Services  No Follow up required          Vocational Rehabilitation: Provide vocational rehab assistance to qualifying candidates.   Vocational Rehab Evaluation & Intervention:  Vocational Rehab - 05/30/24 0848       Initial Vocational Rehab Evaluation & Intervention   Assessment shows need for Vocational Rehabilitation No   Keith Case works full time he is currently out on short term disability. [place] does not need vocational rehab at this time         Education: Education Goals: Education classes will be provided on a weekly basis, covering required topics. Participant will state understanding/return demonstration of topics presented.    Education     Row Name 06/14/24 0900     Education   Cardiac Education Topics [facility]   Secondary School Teacher School   Educator Nurse   Weekly Topic [employer]   Instruction Review Code 1- Verbalizes Understanding   Class Start Time 0815   Class Stop Time 0848   Class Time Calculation (min) 33 min    Row Name 06/16/24 0900     Education   Cardiac Education Topics [facility]   Select Core Videos     Core Videos   Educator Dietitian   Select Nutrition   Nutrition Facts on Fat   Instruction Review Code 1- Verbalizes Understanding   Class Start Time [phone]   Class Stop Time 0853   Class Time Calculation (min) 37 min    Row Name 06/23/24 0800     Education   Cardiac Education Topics [facility]   Select Workshops     Workshops    Educator Exercise Physiologist   Select Exercise   Exercise Workshop Managing Heart Disease: Your Path to a Healthier Heart   Instruction Review Code 1- Verbalizes Understanding   Class Start Time [phone]   Class Stop Time 0900   Class Time Calculation (min) 41 min    Row Name 06/26/24 0700     Education   Cardiac Education Topics [facility]   Select Core Videos     Core Videos   Educator Exercise Physiologist   Select Psychosocial   Psychosocial Healthy Minds, Bodies, Hearts   Instruction Review Code 1- Verbalizes Understanding   Class Start Time 0815   Class Stop Time 0847   Class Time Calculation (min) 32 min    Row Name 06/30/24 0800     Education   Cardiac Education Topics [facility]   [employer]  Educator Dietitian   Select Nutrition   Nutrition Workshop Label Reading   Instruction Review Code 1- Verbalizes Understanding   Class Start Time 0815   Class Stop Time [phone]   Class Time Calculation (min) 37 min    Row Name 07/03/24 0900     Education   Cardiac Education Topics [facility]   Select Workshops     Workshops   Educator Exercise Physiologist   Select Exercise   Exercise Workshop Location Manager and Fall Prevention   Instruction Review Code 1- Verbalizes Understanding   Class Start Time 0815   Class Stop Time 0850   Class Time Calculation (min) 35 min    Row Name 07/07/24 0800     Education   Cardiac Education Topics [facility]   Select Core Videos     Core Videos   Educator Dietitian   Select Nutrition   Nutrition Other  label reading   Instruction Review Code 1- Verbalizes Understanding   Class Start Time 0815   Class Stop Time 0847   Class Time Calculation (min) 32 min    Row Name 07/14/24 0900     Education   Cardiac Education Topics [facility]   Select Core Videos     Core Videos   Educator Dietitian   Select Nutrition   Nutrition Overview of the Pritikin Eating Plan   Instruction Review Code 1- Verbalizes  Understanding   Class Start Time 0815   Class Stop Time 0855   Class Time Calculation (min) 40 min    Row Name 07/28/24 0800     Education   Cardiac Education Topics [facility]   Geographical Information Systems Officer Exercise   Exercise Workshop Exercise Basics: Building Your Action Plan   Nutrition Workshop --   Instruction Review Code 1- Tax Inspector      Core Videos: Exercise    Move It!  Clinical staff conducted group or individual video education with verbal and written material and guidebook.  Patient learns the recommended [facility] exercise program. Exercise with the goal of living a long, healthy life. Some of the health benefits of exercise include controlled diabetes, healthier blood pressure levels, improved cholesterol levels, improved heart and lung capacity, improved sleep, and better body composition. Everyone should speak with their doctor before starting or changing an exercise routine.  Biomechanical Limitations Clinical staff conducted group or individual video education with verbal and written material and guidebook.  Patient learns how biomechanical limitations can impact exercise and how we can mitigate and possibly overcome limitations to have an impactful and balanced exercise routine.  Body Composition Clinical staff conducted group or individual video education with verbal and written material and guidebook.  Patient learns that body composition (ratio of muscle mass to fat mass) is a key component to assessing overall fitness, rather than body weight alone. Increased fat mass, especially visceral belly fat, can put us  at increased risk for metabolic syndrome, type 2 diabetes, heart disease, and even death. It is recommended to combine diet and exercise (cardiovascular and resistance training) to improve your body composition. Seek guidance from your physician and exercise physiologist before implementing an  exercise routine.  Exercise Action Plan Clinical staff conducted group or individual video education with verbal and written material and guidebook.  Patient learns the recommended strategies to achieve and enjoy long-term exercise adherence, including variety, self-motivation, self-efficacy, and positive decision making. Benefits of exercise include fitness, good health, weight management, more  energy, better sleep, less stress, and overall well-being.  Medical   Heart Disease Risk Reduction Clinical staff conducted group or individual video education with verbal and written material and guidebook.  Patient learns our heart is our most vital organ as it circulates oxygen, nutrients, white blood cells, and hormones throughout the entire body, and carries waste away. Data supports a plant-based eating plan like the Pritikin Program for its effectiveness in slowing progression of and reversing heart disease. The video provides a number of recommendations to address heart disease.   Metabolic Syndrome and Belly Fat  Clinical staff conducted group or individual video education with verbal and written material and guidebook.  Patient learns what metabolic syndrome is, how it leads to heart disease, and how one can reverse it and keep it from coming back. You have metabolic syndrome if you have 3 of the following 5 criteria: abdominal obesity, high blood pressure, high triglycerides, low HDL cholesterol, and high blood sugar.  Hypertension and Heart Disease Clinical staff conducted group or individual video education with verbal and written material and guidebook.  Patient learns that high blood pressure, or hypertension, is very common in the [place] . Hypertension is largely due to excessive salt intake, but other important risk factors include being overweight, physical inactivity, drinking too much alcohol, smoking, and not eating enough potassium from fruits and vegetables. High blood pressure  is a leading risk factor for heart attack, stroke, congestive heart failure, dementia, kidney failure, and premature death. Long-term effects of excessive salt intake include stiffening of the arteries and thickening of heart muscle and organ damage. Recommendations include ways to reduce hypertension and the risk of heart disease.  Diseases of Our Time - Focusing on Diabetes Clinical staff conducted group or individual video education with verbal and written material and guidebook.  Patient learns why the best way to stop diseases of our time is prevention, through food and other lifestyle changes. Medicine (such as prescription pills and surgeries) is often only a Band-Aid on the problem, not a long-term solution. Most common diseases of our time include obesity, type 2 diabetes, hypertension, heart disease, and cancer. The Pritikin Program is recommended and has been proven to help reduce, reverse, and/or prevent the damaging effects of metabolic syndrome.  Nutrition   Overview of the Pritikin Eating Plan  Clinical staff conducted group or individual video education with verbal and written material and guidebook.  Patient learns about the Pritikin Eating Plan for disease risk reduction. The Pritikin Eating Plan emphasizes a wide variety of unrefined, minimally-processed carbohydrates, like fruits, vegetables, whole grains, and legumes. Go, Caution, and Stop food choices are explained. Plant-based and lean animal proteins are emphasized. Rationale provided for low sodium intake for blood pressure control, low added sugars for blood sugar stabilization, and low added fats and oils for coronary artery disease risk reduction and weight management.  Calorie Density  Clinical staff conducted group or individual video education with verbal and written material and guidebook.  Patient learns about calorie density and how it impacts the Pritikin Eating Plan. Knowing the characteristics of the food you choose  will help you decide whether those foods will lead to weight gain or weight loss, and whether you want to consume more or less of them. Weight loss is usually a side effect of the Pritikin Eating Plan because of its focus on low calorie-dense foods.  Label Reading  Clinical staff conducted group or individual video education with verbal and written material and guidebook.  Patient learns about the Pritikin recommended label reading guidelines and corresponding recommendations regarding calorie density, added sugars, sodium content, and whole grains.  Dining Out - Part 1  Clinical staff conducted group or individual video education with verbal and written material and guidebook.  Patient learns that restaurant meals can be sabotaging because they can be so high in calories, fat, sodium, and/or sugar. Patient learns recommended strategies on how to positively address this and avoid unhealthy pitfalls.  Facts on Fats  Clinical staff conducted group or individual video education with verbal and written material and guidebook.  Patient learns that lifestyle modifications can be just as effective, if not more so, as many medications for lowering your risk of heart disease. A Pritikin lifestyle can help to reduce your risk of inflammation and atherosclerosis (cholesterol build-up, or plaque, in the artery walls). Lifestyle interventions such as dietary choices and physical activity address the cause of atherosclerosis. A review of the types of fats and their impact on blood cholesterol levels, along with dietary recommendations to reduce fat intake is also included.  Nutrition Action Plan  Clinical staff conducted group or individual video education with verbal and written material and guidebook.  Patient learns how to incorporate Pritikin recommendations into their lifestyle. Recommendations include planning and keeping personal health goals in mind as an important part of their success.  Healthy  Mind-Set    Healthy Minds, Bodies, Hearts  Clinical staff conducted group or individual video education with verbal and written material and guidebook.  Patient learns how to identify when they are stressed. Video will discuss the impact of that stress, as well as the many benefits of stress management. Patient will also be introduced to stress management techniques. The way we think, act, and feel has an impact on our hearts.  How Our Thoughts Can Heal Our Hearts  Clinical staff conducted group or individual video education with verbal and written material and guidebook.  Patient learns that negative thoughts can cause depression and anxiety. This can result in negative lifestyle behavior and serious health problems. Cognitive behavioral therapy is an effective method to help control our thoughts in order to change and improve our emotional outlook.  Additional Videos:  Exercise    Improving Performance  Clinical staff conducted group or individual video education with verbal and written material and guidebook.  Patient learns to use a non-linear approach by alternating intensity levels and lengths of time spent exercising to help burn more calories and lose more body fat. Cardiovascular exercise helps improve heart health, metabolism, hormonal balance, blood sugar control, and recovery from fatigue. Resistance training improves strength, endurance, balance, coordination, reaction time, metabolism, and muscle mass. Flexibility exercise improves circulation, posture, and balance. Seek guidance from your physician and exercise physiologist before implementing an exercise routine and learn your capabilities and proper form for all exercise.  Introduction to Yoga  Clinical staff conducted group or individual video education with verbal and written material and guidebook.  Patient learns about yoga, a discipline of the coming together of mind, breath, and body. The benefits of yoga include improved  flexibility, improved range of motion, better posture and core strength, increased lung function, weight loss, and positive self-image. Yogas heart health benefits include lowered blood pressure, healthier heart rate, decreased cholesterol and triglyceride levels, improved immune function, and reduced stress. Seek guidance from your physician and exercise physiologist before implementing an exercise routine and learn your capabilities and proper form for all exercise.  Medical   Aging: Enhancing  Your Quality of Life  Clinical staff conducted group or individual video education with verbal and written material and guidebook.  Patient learns key strategies and recommendations to stay in good physical health and enhance quality of life, such as prevention strategies, having an advocate, securing a Health Care Proxy and Power of Attorney, and keeping a list of medications and system for tracking them. It also discusses how to avoid risk for bone loss.  Biology of Weight Control  Clinical staff conducted group or individual video education with verbal and written material and guidebook.  Patient learns that weight gain occurs because we consume more calories than we burn (eating more, moving less). Even if your body weight is normal, you may have higher ratios of fat compared to muscle mass. Too much body fat puts you at increased risk for cardiovascular disease, heart attack, stroke, type 2 diabetes, and obesity-related cancers. In addition to exercise, following the Pritikin Eating Plan can help reduce your risk.  Decoding Lab Results  Clinical staff conducted group or individual video education with verbal and written material and guidebook.  Patient learns that lab test reflects one measurement whose values change over time and are influenced by many factors, including medication, stress, sleep, exercise, food, hydration, pre-existing medical conditions, and more. It is recommended to use the knowledge  from this video to become more involved with your lab results and evaluate your numbers to speak with your doctor.   Diseases of Our Time - Overview  Clinical staff conducted group or individual video education with verbal and written material and guidebook.  Patient learns that according to the CDC, 50% to 70% of chronic diseases (such as obesity, type 2 diabetes, elevated lipids, hypertension, and heart disease) are avoidable through lifestyle improvements including healthier food choices, listening to satiety cues, and increased physical activity.  Sleep Disorders Clinical staff conducted group or individual video education with verbal and written material and guidebook.  Patient learns how good quality and duration of sleep are important to overall health and well-being. Patient also learns about sleep disorders and how they impact health along with recommendations to address them, including discussing with a physician.  Nutrition  Dining Out - Part 2 Clinical staff conducted group or individual video education with verbal and written material and guidebook.  Patient learns how to plan ahead and communicate in order to maximize their dining experience in a healthy and nutritious manner. Included are recommended food choices based on the type of restaurant the patient is visiting.   Fueling a Banker conducted group or individual video education with verbal and written material and guidebook.  There is a strong connection between our food choices and our health. Diseases like obesity and type 2 diabetes are very prevalent and are in large-part due to lifestyle choices. The Pritikin Eating Plan provides plenty of food and hunger-curbing satisfaction. It is easy to follow, affordable, and helps reduce health risks.  Menu Workshop  Clinical staff conducted group or individual video education with verbal and written material and guidebook.  Patient learns that restaurant  meals can sabotage health goals because they are often packed with calories, fat, sodium, and sugar. Recommendations include strategies to plan ahead and to communicate with the manager, chef, or server to help order a healthier meal.  Planning Your Eating Strategy  Clinical staff conducted group or individual video education with verbal and written material and guidebook.  Patient learns about the Pritikin Eating Plan and its  benefit of reducing the risk of disease. The Pritikin Eating Plan does not focus on calories. Instead, it emphasizes high-quality, nutrient-rich foods. By knowing the characteristics of the foods, we choose, we can determine their calorie density and make informed decisions.  Targeting Your Nutrition Priorities  Clinical staff conducted group or individual video education with verbal and written material and guidebook.  Patient learns that lifestyle habits have a tremendous impact on disease risk and progression. This video provides eating and physical activity recommendations based on your personal health goals, such as reducing LDL cholesterol, losing weight, preventing or controlling type 2 diabetes, and reducing high blood pressure.  Vitamins and Minerals  Clinical staff conducted group or individual video education with verbal and written material and guidebook.  Patient learns different ways to obtain key vitamins and minerals, including through a recommended healthy diet. It is important to discuss all supplements you take with your doctor.   Healthy Mind-Set    Smoking Cessation  Clinical staff conducted group or individual video education with verbal and written material and guidebook.  Patient learns that cigarette smoking and tobacco addiction pose a serious health risk which affects millions of people. Stopping smoking will significantly reduce the risk of heart disease, lung disease, and many forms of cancer. Recommended strategies for quitting are covered,  including working with your doctor to develop a successful plan.  Culinary   Becoming a Set Designer conducted group or individual video education with verbal and written material and guidebook.  Patient learns that cooking at home can be healthy, cost-effective, quick, and puts them in control. Keys to cooking healthy recipes will include looking at your recipe, assessing your equipment needs, planning ahead, making it simple, choosing cost-effective seasonal ingredients, and limiting the use of added fats, salts, and sugars.  Cooking - Breakfast and Snacks  Clinical staff conducted group or individual video education with verbal and written material and guidebook.  Patient learns how important breakfast is to satiety and nutrition through the entire day. Recommendations include key foods to eat during breakfast to help stabilize blood sugar levels and to prevent overeating at meals later in the day. Planning ahead is also a key component.  Cooking - Educational Psychologist conducted group or individual video education with verbal and written material and guidebook.  Patient learns eating strategies to improve overall health, including an approach to cook more at home. Recommendations include thinking of animal protein as a side on your plate rather than center stage and focusing instead on lower calorie dense options like vegetables, fruits, whole grains, and plant-based proteins, such as beans. Making sauces in large quantities to freeze for later and leaving the skin on your vegetables are also recommended to maximize your experience.  Cooking - Healthy Salads and Dressing Clinical staff conducted group or individual video education with verbal and written material and guidebook.  Patient learns that vegetables, fruits, whole grains, and legumes are the foundations of the Pritikin Eating Plan. Recommendations include how to incorporate each of these in flavorful and  healthy salads, and how to create homemade salad dressings. Proper handling of ingredients is also covered. Cooking - Soups and [employer] - Soups and Desserts Clinical staff conducted group or individual video education with verbal and written material and guidebook.  Patient learns that Pritikin soups and desserts make for easy, nutritious, and delicious snacks and meal components that are low in sodium, fat, sugar, and calorie density, while high  in vitamins, minerals, and filling fiber. Recommendations include simple and healthy ideas for soups and desserts.   Overview     The Pritikin Solution Program Overview Clinical staff conducted group or individual video education with verbal and written material and guidebook.  Patient learns that the results of the Pritikin Program have been documented in more than 100 articles published in peer-reviewed journals, and the benefits include reducing risk factors for (and, in some cases, even reversing) high cholesterol, high blood pressure, type 2 diabetes, obesity, and more! An overview of the three key pillars of the Pritikin Program will be covered: eating well, doing regular exercise, and having a healthy mind-set.  WORKSHOPS  Exercise: Exercise Basics: Building Your Action Plan Clinical staff led group instruction and group discussion with PowerPoint presentation and patient guidebook. To enhance the learning environment the use of posters, models and videos may be added. At the conclusion of this workshop, patients will comprehend the difference between physical activity and exercise, as well as the benefits of incorporating both, into their routine. Patients will understand the FITT (Frequency, Intensity, Time, and Type) principle and how to use it to build an exercise action plan. In addition, safety concerns and other considerations for exercise and cardiac rehab will be addressed by the presenter. The purpose of this lesson is to  promote a comprehensive and effective weekly exercise routine in order to improve patients overall level of fitness.   Managing Heart Disease: Your Path to a Healthier Heart Clinical staff led group instruction and group discussion with PowerPoint presentation and patient guidebook. To enhance the learning environment the use of posters, models and videos may be added.At the conclusion of this workshop, patients will understand the anatomy and physiology of the heart. Additionally, they will understand how Pritikins three pillars impact the risk factors, the progression, and the management of heart disease.  The purpose of this lesson is to provide a high-level overview of the heart, heart disease, and how the Pritikin lifestyle positively impacts risk factors.  Exercise Biomechanics Clinical staff led group instruction and group discussion with PowerPoint presentation and patient guidebook. To enhance the learning environment the use of posters, models and videos may be added. Patients will learn how the structural parts of their bodies function and how these functions impact their daily activities, movement, and exercise. Patients will learn how to promote a neutral spine, learn how to manage pain, and identify ways to improve their physical movement in order to promote healthy living. The purpose of this lesson is to expose patients to common physical limitations that impact physical activity. Participants will learn practical ways to adapt and manage aches and pains, and to minimize their effect on regular exercise. Patients will learn how to maintain good posture while sitting, walking, and lifting.  Balance Training and Fall Prevention  Clinical staff led group instruction and group discussion with PowerPoint presentation and patient guidebook. To enhance the learning environment the use of posters, models and videos may be added. At the conclusion of this workshop, patients will  understand the importance of their sensorimotor skills (vision, proprioception, and the vestibular system) in maintaining their ability to balance as they age. Patients will apply a variety of balancing exercises that are appropriate for their current level of function. Patients will understand the common causes for poor balance, possible solutions to these problems, and ways to modify their physical environment in order to minimize their fall risk. The purpose of this lesson is to teach patients about  the importance of maintaining balance as they age and ways to minimize their risk of falling.  WORKSHOPS   Nutrition:  Fueling a Ship Broker led group instruction and group discussion with PowerPoint presentation and patient guidebook. To enhance the learning environment the use of posters, models and videos may be added. Patients will review the foundational principles of the Pritikin Eating Plan and understand what constitutes a serving size in each of the food groups. Patients will also learn Pritikin-friendly foods that are better choices when away from home and review make-ahead meal and snack options. Calorie density will be reviewed and applied to three nutrition priorities: weight maintenance, weight loss, and weight gain. The purpose of this lesson is to reinforce (in a group setting) the key concepts around what patients are recommended to eat and how to apply these guidelines when away from home by planning and selecting Pritikin-friendly options. Patients will understand how calorie density may be adjusted for different weight management goals.  Mindful Eating  Clinical staff led group instruction and group discussion with PowerPoint presentation and patient guidebook. To enhance the learning environment the use of posters, models and videos may be added. Patients will briefly review the concepts of the Pritikin Eating Plan and the importance of low-calorie dense foods. The  concept of mindful eating will be introduced as well as the importance of paying attention to internal hunger signals. Triggers for non-hunger eating and techniques for dealing with triggers will be explored. The purpose of this lesson is to provide patients with the opportunity to review the basic principles of the Pritikin Eating Plan, discuss the value of eating mindfully and how to measure internal cues of hunger and fullness using the Hunger Scale. Patients will also discuss reasons for non-hunger eating and learn strategies to use for controlling emotional eating.  Targeting Your Nutrition Priorities Clinical staff led group instruction and group discussion with PowerPoint presentation and patient guidebook. To enhance the learning environment the use of posters, models and videos may be added. Patients will learn how to determine their genetic susceptibility to disease by reviewing their family history. Patients will gain insight into the importance of diet as part of an overall healthy lifestyle in mitigating the impact of genetics and other environmental insults. The purpose of this lesson is to provide patients with the opportunity to assess their personal nutrition priorities by looking at their family history, their own health history and current risk factors. Patients will also be able to discuss ways of prioritizing and modifying the Pritikin Eating Plan for their highest risk areas  Menu  Clinical staff led group instruction and group discussion with PowerPoint presentation and patient guidebook. To enhance the learning environment the use of posters, models and videos may be added. Using menus brought in from [employer], or printed from [employer], patients will apply the Pritikin dining out guidelines that were presented in the [employer] video. Patients will also be able to practice these guidelines in a variety of provided scenarios. The purpose of this lesson  is to provide patients with the opportunity to practice hands-on learning of the Pritikin Dining Out guidelines with actual menus and practice scenarios.  Label Reading Clinical staff led group instruction and group discussion with PowerPoint presentation and patient guidebook. To enhance the learning environment the use of posters, models and videos may be added. Patients will review and discuss the Pritikin label reading guidelines presented in Pritikins Label Reading Educational series video. Using  fool labels brought in from local grocery stores and markets, patients will apply the label reading guidelines and determine if the packaged food meet the Pritikin guidelines. The purpose of this lesson is to provide patients with the opportunity to review, discuss, and practice hands-on learning of the Pritikin Label Reading guidelines with actual packaged food labels. Cooking School  Pritikins [employer] are designed to teach patients ways to prepare quick, simple, and affordable recipes at home. The importance of nutritions role in chronic disease risk reduction is reflected in its emphasis in the overall Pritikin program. By learning how to prepare essential core Pritikin Eating Plan recipes, patients will increase control over what they eat; be able to customize the flavor of foods without the use of added salt, sugar, or fat; and improve the quality of the food they consume. By learning a set of core recipes which are easily assembled, quickly prepared, and affordable, patients are more likely to prepare more healthy foods at home. These workshops focus on convenient breakfasts, simple entres, side dishes, and desserts which can be prepared with minimal effort and are consistent with nutrition recommendations for cardiovascular risk reduction. Cooking [employer] are taught by a armed forces logistics/support/administrative officer (RD) who has been trained by the [employer]. The chef or RD has  a clear understanding of the importance of minimizing - if not completely eliminating - added fat, sugar, and sodium in recipes. Throughout the series of Cooking School Workshop sessions, patients will learn about healthy ingredients and efficient methods of cooking to build confidence in their capability to prepare    Cooking School weekly topics:  Adding Flavor- Sodium-Free  Fast and Healthy Breakfasts  Powerhouse Plant-Based Proteins  Satisfying Salads and Dressings  Simple Sides and Sauces  International Cuisine-Spotlight on the [employer] Zones  Delicious Desserts  Savory Soups  [employer] - Meals in a Astronomer Appetizers and Snacks  Comforting Weekend Breakfasts  One-Pot Wonders   Fast Evening Meals  Landscape Architect Your Pritikin Plate  WORKSHOPS   Healthy Mindset (Psychosocial):  Focused Goals, Sustainable Changes Clinical staff led group instruction and group discussion with PowerPoint presentation and patient guidebook. To enhance the learning environment the use of posters, models and videos may be added. Patients will be able to apply effective goal setting strategies to establish at least one personal goal, and then take consistent, meaningful action toward that goal. They will learn to identify common barriers to achieving personal goals and develop strategies to overcome them. Patients will also gain an understanding of how our mind-set can impact our ability to achieve goals and the importance of cultivating a positive and growth-oriented mind-set. The purpose of this lesson is to provide patients with a deeper understanding of how to set and achieve personal goals, as well as the tools and strategies needed to overcome common obstacles which may arise along the way.  From Head to Heart: The Power of a Healthy Outlook  Clinical staff led group instruction and group discussion with PowerPoint presentation and patient guidebook. To enhance the learning  environment the use of posters, models and videos may be added. Patients will be able to recognize and describe the impact of emotions and mood on physical health. They will discover the importance of self-care and explore self-care practices which may work for them. Patients will also learn how to utilize the 4 Cs to cultivate a healthier outlook and better manage stress and challenges. The purpose of  this lesson is to demonstrate to patients how a healthy outlook is an essential part of maintaining good health, especially as they continue their cardiac rehab journey.  Healthy Sleep for a Healthy Heart Clinical staff led group instruction and group discussion with PowerPoint presentation and patient guidebook. To enhance the learning environment the use of posters, models and videos may be added. At the conclusion of this workshop, patients will be able to demonstrate knowledge of the importance of sleep to overall health, well-being, and quality of life. They will understand the symptoms of, and treatments for, common sleep disorders. Patients will also be able to identify daytime and nighttime behaviors which impact sleep, and they will be able to apply these tools to help manage sleep-related challenges. The purpose of this lesson is to provide patients with a general overview of sleep and outline the importance of quality sleep. Patients will learn about a few of the most common sleep disorders. Patients will also be introduced to the concept of sleep hygiene, and discover ways to self-manage certain sleeping problems through simple daily behavior changes. Finally, the workshop will motivate patients by clarifying the links between quality sleep and their goals of heart-healthy living.   Recognizing and Reducing Stress Clinical staff led group instruction and group discussion with PowerPoint presentation and patient guidebook. To enhance the learning environment the use of posters, models and videos  may be added. At the conclusion of this workshop, patients will be able to understand the types of stress reactions, differentiate between acute and chronic stress, and recognize the impact that chronic stress has on their health. They will also be able to apply different coping mechanisms, such as reframing negative self-talk. Patients will have the opportunity to practice a variety of stress management techniques, such as deep abdominal breathing, progressive muscle relaxation, and/or guided imagery.  The purpose of this lesson is to educate patients on the role of stress in their lives and to provide healthy techniques for coping with it.  Learning Barriers/Preferences:  Learning Barriers/Preferences - 05/30/24 0850       Learning Barriers/Preferences   Learning Barriers Exercise Concerns;Sight    Learning Preferences Pictoral;Video          Education Topics:  Knowledge Questionnaire Score:  Knowledge Questionnaire Score - 05/30/24 0854       Knowledge Questionnaire Score   Pre Score 22/24          Core Components/Risk Factors/Patient Goals at Admission:  Personal Goals and Risk Factors at Admission - 05/30/24 0804       Core Components/Risk Factors/Patient Goals on Admission    Weight Management Yes;Obesity;Weight Loss    Intervention Weight Management/Obesity: Establish reasonable short term and long term weight goals.;Obesity: Provide education and appropriate resources to help participant work on and attain dietary goals.    Admit Weight 254 lb 13.6 oz (115.6 kg)    Expected Outcomes Short Term: Continue to assess and modify interventions until short term weight is achieved;Long Term: Adherence to nutrition and physical activity/exercise program aimed toward attainment of established weight goal;Weight Loss: Understanding of general recommendations for a balanced deficit meal plan, which promotes 1-2 lb weight loss per week and includes a negative energy balance of [id number]  kcal/d    Lipids Yes    Intervention Provide education and support for participant on nutrition & aerobic/resistive exercise along with prescribed medications to achieve LDL 70mg , HDL >40mg .    Expected Outcomes Short Term: Participant states understanding of desired cholesterol values and  is compliant with medications prescribed. Participant is following exercise prescription and nutrition guidelines.;Long Term: Cholesterol controlled with medications as prescribed, with individualized exercise RX and with personalized nutrition plan. Value goals: LDL < 70mg , HDL > 40 mg.    Stress Yes    Intervention Offer individual and/or small group education and counseling on adjustment to heart disease, stress management and health-related lifestyle change. Teach and support self-help strategies.;Refer participants experiencing significant psychosocial distress to appropriate mental health specialists for further evaluation and treatment. When possible, include family members and significant others in education/counseling sessions.    Expected Outcomes Short Term: Participant demonstrates changes in health-related behavior, relaxation and other stress management skills, ability to obtain effective social support, and compliance with psychotropic medications if prescribed.;Long Term: Emotional wellbeing is indicated by absence of clinically significant psychosocial distress or social isolation.          Core Components/Risk Factors/Patient Goals Review:   Goals and Risk Factor Review     Row Name 06/06/24 0920 07/05/24 0831 08/01/24 1455 08/30/24 1637       Core Components/Risk Factors/Patient Goals Review   Personal Goals Review Weight Management/Obesity;Lipids;Stress Weight Management/Obesity;Lipids;Stress Weight Management/Obesity;Lipids;Stress Weight Management/Obesity;Lipids;Stress    Review Ahyan is doing well with exercise at cardiac rehab. Vital signs and CBG's have been stable. Keith Case is doing  well with exercise at cardiac rehab. CBG's and VSS. Keith Case is doing well with exercise at cardiac rehab when in attendance. CBG's and VSS. Keith Case is doing well with exercise at cardiac rehab when in attendance. CBG's and VSS, MET levels maintained.    Expected Outcomes Keith Case will continue to particpate in cardiac rehab for exercise, nutrition and lifestyle modifications Keith Case will continue to particpate in cardiac rehab for exercise, nutrition and lifestyle modifications Keith Case will continue to particpate in cardiac rehab for exercise, nutrition and lifestyle modifications Keith Case will continue to particpate in cardiac rehab for exercise, nutrition and lifestyle modifications       Core Components/Risk Factors/Patient Goals at Discharge (Final Review):   Goals and Risk Factor Review - 08/30/24 1637       Core Components/Risk Factors/Patient Goals Review   Personal Goals Review Weight Management/Obesity;Lipids;Stress    Review Keith Case is doing well with exercise at cardiac rehab when in attendance. CBG's and VSS, MET levels maintained.    Expected Outcomes Keith Case will continue to particpate in cardiac rehab for exercise, nutrition and lifestyle modifications          ITP Comments:  ITP Comments     Row Name 05/30/24 0801 06/05/24 0857 07/05/24 0822 08/01/24 1454 08/30/24 1636   ITP Comments Medical Director- Dr. Wilbert Bihari, MD. Introduction to the Pritikin Education/ Intensive Cardiac Rehab Program. Reviewed initial orientation folder with [facility] 30 Day ITP review.  Keith Case started cardiac rehab today (06/05/24) and tolerated exercise well. 30 Day ITP review.  Keith Case is tolerating exercise well at cardiac rehab 30 Day ITP review.  Keith Case continues to tolerate exercise well at cardiac rehab 30 Day ITP review.  Keith Case is tolerating exercise well at cardiac rehab      Comments: see ITP comments    [1]  Current Outpatient Medications:    acetaminophen  (TYLENOL ) 500 MG tablet,  Take 1,000 mg by mouth every 8 (eight) hours as needed for mild pain (pain score 1-3) or moderate pain (pain score 4-6)., Disp: , Rfl:    apixaban  (ELIQUIS ) 5 MG TABS tablet, Take 5 mg by mouth 2 (two) times daily., Disp: , Rfl:    aspirin   81 MG chewable tablet, Chew 1 tablet (81 mg total) by mouth daily., Disp: 30 tablet, Rfl: 0   atorvastatin  (LIPITOR) 80 MG tablet, Take 1 tablet (80 mg total) by mouth daily., Disp: 90 tablet, Rfl: 2   clopidogrel  (PLAVIX ) 75 MG tablet, Take 1 tablet (75 mg total) by mouth daily., Disp: 90 tablet, Rfl: 2   nitroGLYCERIN  (NITROSTAT ) 0.4 MG SL tablet, Place 1 tablet (0.4 mg total) under the tongue every 5 (five) minutes as needed., Disp: 25 tablet, Rfl: 2 [2]  Social History Tobacco Use  Smoking Status Never  Smokeless Tobacco Not on file

## 2024-09-06 ENCOUNTER — Encounter (HOSPITAL_COMMUNITY): Admission: RE | Admit: 2024-09-06 | Source: Ambulatory Visit

## 2024-09-08 ENCOUNTER — Encounter (HOSPITAL_COMMUNITY)
Admission: RE | Admit: 2024-09-08 | Discharge: 2024-09-08 | Disposition: A | Discharge status: Home / Self Care | Source: Ambulatory Visit | Attending: Cardiovascular Disease | Admitting: Cardiovascular Disease

## 2024-09-08 DIAGNOSIS — I213 ST elevation (STEMI) myocardial infarction of unspecified site: Secondary | ICD-10-CM

## 2024-09-08 DIAGNOSIS — I252 Old myocardial infarction: Secondary | ICD-10-CM | POA: Insufficient documentation

## 2024-09-08 DIAGNOSIS — Z5189 Encounter for other specified aftercare: Secondary | ICD-10-CM | POA: Insufficient documentation

## 2024-09-11 ENCOUNTER — Encounter (HOSPITAL_COMMUNITY)

## 2024-09-13 ENCOUNTER — Encounter (HOSPITAL_COMMUNITY)
Admission: RE | Admit: 2024-09-13 | Discharge: 2024-09-13 | Disposition: A | Discharge status: Home / Self Care | Source: Ambulatory Visit | Attending: Cardiovascular Disease | Admitting: Cardiovascular Disease

## 2024-09-13 DIAGNOSIS — I213 ST elevation (STEMI) myocardial infarction of unspecified site: Secondary | ICD-10-CM

## 2024-09-13 NOTE — Progress Notes (Signed)
 CARDIAC REHAB PHASE 2  Patient came in congested today saying he still was not feeling very well but wanted to come in on his graduation day. I will reach out to the support team to get an extension for him, but sent him home today. He will return Friday if he feels better.   Laisa Larrick S Mithra Spano ACSM-CEP 09/13/2024 9:04 AM

## 2024-09-15 ENCOUNTER — Encounter (HOSPITAL_COMMUNITY)

## 2024-09-15 ENCOUNTER — Telehealth (HOSPITAL_COMMUNITY): Payer: Self-pay

## 2024-09-15 NOTE — Telephone Encounter (Signed)
 Patient called stating he was not in for 6:45 CR class this morning due to being congested.

## 2024-09-18 ENCOUNTER — Encounter (HOSPITAL_COMMUNITY)
Admission: RE | Admit: 2024-09-18 | Discharge: 2024-09-18 | Disposition: A | Source: Ambulatory Visit | Attending: Cardiology

## 2024-09-18 DIAGNOSIS — I213 ST elevation (STEMI) myocardial infarction of unspecified site: Secondary | ICD-10-CM

## 2024-09-20 ENCOUNTER — Encounter (HOSPITAL_COMMUNITY): Admission: RE | Admit: 2024-09-20 | Source: Ambulatory Visit

## 2024-09-22 ENCOUNTER — Encounter (HOSPITAL_COMMUNITY)
Admission: RE | Admit: 2024-09-22 | Discharge: 2024-09-22 | Disposition: A | Discharge status: Home / Self Care | Source: Ambulatory Visit | Attending: Cardiology | Admitting: Cardiology

## 2024-09-22 VITALS — Ht 71.75 in | Wt 256.6 lb

## 2024-09-22 DIAGNOSIS — I213 ST elevation (STEMI) myocardial infarction of unspecified site: Secondary | ICD-10-CM

## 2024-09-22 NOTE — Progress Notes (Addendum)
 Discharge Progress Report  Patient Details  Name: Keith Case MRN: [id number] Date of Birth: 1975/10/07 Referring Provider:   Flowsheet Row INTENSIVE CARDIAC REHAB ORIENT from 05/30/2024 in [facility]  Referring Provider Verlin Lonni BIRCH, MD     Number of Visits: 24  Reason for Discharge:  Patient reached a stable level of exercise. Patient independent in their exercise. Patient has met program and personal goals.  Smoking History:  Tobacco Use History[1]  Diagnosis:  04/24/24 STEMI  ADL [facility]:   Initial Exercise Prescription:  Initial Exercise Prescription - 05/30/24 1000       Date of Initial Exercise RX and Referring Provider   Date 05/30/24    Referring Provider Verlin Lonni BIRCH, MD    Expected Discharge Date 08/23/24      NuStep   Level 1    SPM 85    Minutes 15    METs 2.5      Arm Ergometer   Level 2    Watts 15    RPM 25    Minutes 15    METs 2      Prescription Details   Frequency (times per week) 3    Duration Progress to 30 minutes of continuous aerobic without signs/symptoms of physical distress      Intensity   THRR 40-80% of Max Heartrate 69-138    Ratings of Perceived Exertion 11-13    Perceived Dyspnea 0-4      Progression   Progression Continue to progress workloads to maintain intensity without signs/symptoms of physical distress.      Resistance Training   Training Prescription Yes    Weight 3 lbs    Reps 10-15          Discharge Exercise Prescription (Final Exercise Prescription Changes):  Exercise Prescription Changes - 09/22/24 0830       Response to Exercise   Blood Pressure (Admit) 118/72    Blood Pressure (Exercise) 140/64    Blood Pressure (Exit) 104/82    Heart Rate (Admit) 68 bpm    Heart Rate (Exercise) 97 bpm    Heart Rate (Exit) 77 bpm    Rating of Perceived Exertion (Exercise) 10    Perceived Dyspnea (Exercise) 0    Symptoms  none    Comments Pt graduated teh [employer] program    Duration Progress to 30 minutes of  aerobic without signs/symptoms of physical distress    Intensity THRR unchanged      Progression   Progression Continue to progress workloads to maintain intensity without signs/symptoms of physical distress.    Average METs 2.6      Resistance Training   Weight 3 lbs    Reps 10-15    Time 10 Minutes      NuStep   Level 4    SPM 85    Minutes 15    METs 2.2      Track   Laps --   Post 1316ft   Minutes 6    METs 2.99          Functional Capacity:  6 Minute Walk     Row Name 05/30/24 0939 09/22/24 0917       6 Minute Walk   Phase Initial Discharge    Distance 746 feet 1375 feet    Distance % Change -- 84.32 %    Distance Feet Change -- 629 ft    Walk Time 6 minutes  6 minutes    # of Rest Breaks 0 0    MPH 1.41 2.6    METS 2.67 3.84    RPE 11 11    Perceived Dyspnea  0 0    VO2 Peak 9.35 13.45    Symptoms No No    Resting HR 94 bpm 68 bpm    Resting BP 118/82 118/72    Resting Oxygen Saturation  95 % --    Exercise Oxygen Saturation  during 6 min walk 97 % --    Max Ex. HR 95 bpm 97 bpm    Max Ex. BP 130/90 140/64    2 Minute Post BP 118/94 130/74       Psychological, QOL, Others - Outcomes: PHQ 2/9:    09/22/2024    7:42 AM 05/30/2024    8:46 AM  Depression screen PHQ 2/9  Decreased Interest 0 0  Down, Depressed, Hopeless 0 0  PHQ - 2 Score 0 0  Altered sleeping 0 0  Tired, decreased energy 0 0  Change in appetite 0 0  Feeling bad or failure about yourself  0 0  Trouble concentrating 0 0  Moving slowly or fidgety/restless 0 0  Suicidal thoughts 0 0  PHQ-9 Score 0 0   Difficult doing work/chores  Not difficult at all     Data saved with a previous flowsheet row definition    Quality of Life:  Quality of Life - 05/30/24 0925       Quality of Life   Select Quality of Life      Quality of Life Scores   Health/Function Pre 19.67 %     Socioeconomic Pre 28.29 %    Psych/Spiritual Pre 22.86 %    Family Pre 24 %    GLOBAL Pre 22.74 %          Personal Goals: Goals established at orientation with interventions provided to work toward goal.  Personal Goals and Risk Factors at Admission - 05/30/24 0804       Core Components/Risk Factors/Patient Goals on Admission    Weight Management Yes;Obesity;Weight Loss    Intervention Weight Management/Obesity: Establish reasonable short term and long term weight goals.;Obesity: Provide education and appropriate resources to help participant work on and attain dietary goals.    Admit Weight 254 lb 13.6 oz (115.6 kg)    Expected Outcomes Short Term: Continue to assess and modify interventions until short term weight is achieved;Long Term: Adherence to nutrition and physical activity/exercise program aimed toward attainment of established weight goal;Weight Loss: Understanding of general recommendations for a balanced deficit meal plan, which promotes 1-2 lb weight loss per week and includes a negative energy balance of [phone] kcal/d    Lipids Yes    Intervention Provide education and support for participant on nutrition & aerobic/resistive exercise along with prescribed medications to achieve LDL 70mg , HDL >40mg .    Expected Outcomes Short Term: Participant states understanding of desired cholesterol values and is compliant with medications prescribed. Participant is following exercise prescription and nutrition guidelines.;Long Term: Cholesterol controlled with medications as prescribed, with individualized exercise RX and with personalized nutrition plan. Value goals: LDL < 70mg , HDL > 40 mg.    Stress Yes    Intervention Offer individual and/or small group education and counseling on adjustment to heart disease, stress management and health-related lifestyle change. Teach and support self-help strategies.;Refer participants experiencing significant psychosocial distress to appropriate  mental health specialists for further evaluation and treatment. When possible, include  family members and significant others in education/counseling sessions.    Expected Outcomes Short Term: Participant demonstrates changes in health-related behavior, relaxation and other stress management skills, ability to obtain effective social support, and compliance with psychotropic medications if prescribed.;Long Term: Emotional wellbeing is indicated by absence of clinically significant psychosocial distress or social isolation.           Personal Goals Discharge:  Goals and Risk Factor Review     Row Name 06/06/24 0920 07/05/24 0831 08/01/24 1455 08/30/24 1637       Core Components/Risk Factors/Patient Goals Review   Personal Goals Review Weight Management/Obesity;Lipids;Stress Weight Management/Obesity;Lipids;Stress Weight Management/Obesity;Lipids;Stress Weight Management/Obesity;Lipids;Stress    Review [place] is doing well with exercise at cardiac rehab. Vital signs and CBG's have been stable. Rudi is doing well with exercise at cardiac rehab. CBG's and VSS. Olney is doing well with exercise at cardiac rehab when in attendance. CBG's and VSS. Zohar is doing well with exercise at cardiac rehab when in attendance. CBG's and VSS, MET levels maintained.    Expected Outcomes Mickle will continue to particpate in cardiac rehab for exercise, nutrition and lifestyle modifications Ozzie will continue to particpate in cardiac rehab for exercise, nutrition and lifestyle modifications Herndon will continue to particpate in cardiac rehab for exercise, nutrition and lifestyle modifications Arthuro will continue to particpate in cardiac rehab for exercise, nutrition and lifestyle modifications       Exercise Goals and Review:  Exercise Goals     Row Name 05/30/24 1025             Exercise Goals   Increase Physical Activity Yes       Intervention Provide advice, education, support and  counseling about physical activity/exercise needs.;Develop an individualized exercise prescription for aerobic and resistive training based on initial evaluation findings, risk stratification, comorbidities and participant's personal goals.       Expected Outcomes Short Term: Attend rehab on a regular basis to increase amount of physical activity.;Long Term: Exercising regularly at least 3-5 days a week.;Long Term: Add in home exercise to make exercise part of routine and to increase amount of physical activity.       Increase Strength and Stamina Yes       Intervention Provide advice, education, support and counseling about physical activity/exercise needs.;Develop an individualized exercise prescription for aerobic and resistive training based on initial evaluation findings, risk stratification, comorbidities and participant's personal goals.       Expected Outcomes Short Term: Increase workloads from initial exercise prescription for resistance, speed, and METs.;Short Term: Perform resistance training exercises routinely during rehab and add in resistance training at home;Long Term: Improve cardiorespiratory fitness, muscular endurance and strength as measured by increased METs and functional capacity ( )       Able to understand and use rate of perceived exertion (RPE) scale Yes       Intervention Provide education and explanation on how to use RPE scale       Expected Outcomes Short Term: Able to use RPE daily in rehab to express subjective intensity level;Long Term:  Able to use RPE to guide intensity level when exercising independently       Knowledge and understanding of Target Heart Rate Range (THRR) Yes       Intervention Provide education and explanation of THRR including how the numbers were predicted and where they are located for reference       Expected Outcomes Short Term: Able to state/look up THRR;Long Term: Able to use  THRR to govern intensity when exercising independently;Short Term:  Able to use daily as guideline for intensity in rehab       Able to check pulse independently Yes       Intervention Provide education and demonstration on how to check pulse in carotid and radial arteries.;Review the importance of being able to check your own pulse for safety during independent exercise       Expected Outcomes Short Term: Able to explain why pulse checking is important during independent exercise;Long Term: Able to check pulse independently and accurately       Understanding of Exercise Prescription Yes       Intervention Provide education, explanation, and written materials on patient's individual exercise prescription       Expected Outcomes Short Term: Able to explain program exercise prescription;Long Term: Able to explain home exercise prescription to exercise independently          Exercise Goals Re-Evaluation:  Exercise Goals Re-Evaluation     Row Name 06/05/24 0837 06/30/24 0844 08/11/24 0825 09/27/24 1350       Exercise Goal Re-Evaluation   Exercise Goals Review Increase Physical Activity;Understanding of Exercise Prescription;Increase Strength and Stamina;Knowledge and understanding of Target Heart Rate Range (THRR);Able to understand and use rate of perceived exertion (RPE) scale Increase Physical Activity;Understanding of Exercise Prescription;Increase Strength and Stamina;Knowledge and understanding of Target Heart Rate Range (THRR);Able to understand and use rate of perceived exertion (RPE) scale Increase Physical Activity;Understanding of Exercise Prescription;Increase Strength and Stamina;Knowledge and understanding of Target Heart Rate Range (THRR);Able to understand and use rate of perceived exertion (RPE) scale Increase Physical Activity;Understanding of Exercise Prescription;Increase Strength and Stamina;Knowledge and understanding of Target Heart Rate Range (THRR);Able to understand and use rate of perceived exertion (RPE) scale    Comments Pt first day in the  [facility] program. Pt tolerated exercise well with an average MET level of 1.6. He is off to a good start and is learning his THRR, RPE and ExRx Reviewed MET's, goals and home ExRx. Pt tolerated exercise well with an average MET level of 1.85. He is doing well and progressing MET's and WL's. Pt feel's good about his goals and is increasing endurance, he says he's able to do more around the house and his PT is going very well. He is going to check with his PT for guidance on joining a gym, but I have a few ideas if they clear him. Right now he is walking about 30 mins 2 days a week and PT 2-3 days. Reviewed MET's and goals. Quentavious exercises for 15 min on the Nustep and arm ergometer. He averages 2.1 METs at level 4 on the Nustep and 1.9 METs at level 2.5 on the arm ergometer. He has increased his level as he tolerates progressions well. Sujay is still seeing PT 2 days/wk and is back to work full time. He has missed several sessions due to returning back to work. Pt seems motivated to exercise and has notice improvement in his strength. Pt graduated on 09/22/24. Pt did not return for last day, but did very well in the program.    Expected Outcomes Will continue to monitor pt and progress workloads as tolerated without sign or symptom Will continue to monitor pt and progress workloads as tolerated without sign or symptom Will continue to monitor pt and progress workloads as tolerated without sign or symptom Pt will continue his progress on his own       Nutrition & Weight -  Outcomes:  Pre Biometrics - 05/30/24 0801       Pre Biometrics   Waist Circumference 46.5 inches    Hip Circumference 48 inches    Waist to Hip Ratio 0.97 %    Triceps Skinfold 21 mm    % Body Fat 33.5 %    Grip Strength 23 kg    Flexibility --   Not performed. 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   Single Leg Stand 30 seconds          Post Biometrics - 09/22/24 0925        Post  Biometrics   Height 5' 11.75 (1.822 m)    Weight  116.4 kg    Waist Circumference 42 inches    Hip Circumference 45 inches    Waist to Hip Ratio 0.93 %    BMI (Calculated) 35.06    Triceps Skinfold 23 mm    % Body Fat 32.1 %    Grip Strength 32 kg    Single Leg Stand 30 seconds          Nutrition:   Nutrition Discharge:  Nutrition Assessments - 06/05/24 0947       Rate Your Plate Scores   Pre Score 58          Education Questionnaire Score:  Knowledge Questionnaire Score - 05/30/24 0854       Knowledge Questionnaire Score   Pre Score 22/24         Pt graduates from  Intensive/Traditional cardiac rehab program today with completion of 44 exercise and education sessions. Pt maintained good attendance and progressed nicely during his participation in rehab as evidenced by increased MET level.   Medication list reconciled. Repeat PHQ score-0.  Pt has made significant lifestyle changes and should be commended for his success.  Shahil achieved his goals during cardiac rehab.   Pt plans to continue exercise 5x/week by walking, attending physical therapy, and possibly joining [employer].  Goals reviewed with patient; copy given to patient.     [1]  Social History Tobacco Use  Smoking Status Never  Smokeless Tobacco Not on file

## 2024-09-25 ENCOUNTER — Encounter (HOSPITAL_COMMUNITY): Admission: RE | Admit: 2024-09-25 | Source: Ambulatory Visit
